# Patient Record
Sex: Female | Born: 1975 | Race: White | Hispanic: No | Marital: Married | Smoking: Never smoker
Health system: Southern US, Community
[De-identification: ages and names within clinical notes are randomized; demographics above are authoritative.]

## PROBLEM LIST (undated history)

## (undated) DIAGNOSIS — F419 Anxiety disorder, unspecified: Secondary | ICD-10-CM

## (undated) DIAGNOSIS — N920 Excessive and frequent menstruation with regular cycle: Secondary | ICD-10-CM

## (undated) DIAGNOSIS — K219 Gastro-esophageal reflux disease without esophagitis: Secondary | ICD-10-CM

## (undated) DIAGNOSIS — D219 Benign neoplasm of connective and other soft tissue, unspecified: Secondary | ICD-10-CM

## (undated) DIAGNOSIS — N946 Dysmenorrhea, unspecified: Secondary | ICD-10-CM

## (undated) DIAGNOSIS — G4733 Obstructive sleep apnea (adult) (pediatric): Secondary | ICD-10-CM

## (undated) DIAGNOSIS — I10 Essential (primary) hypertension: Secondary | ICD-10-CM

## (undated) HISTORY — DX: Obstructive sleep apnea (adult) (pediatric): G47.33

## (undated) HISTORY — DX: Benign neoplasm of connective and other soft tissue, unspecified: D21.9

## (undated) HISTORY — DX: Dysmenorrhea, unspecified: N94.6

## (undated) HISTORY — DX: Gastro-esophageal reflux disease without esophagitis: K21.9

## (undated) HISTORY — DX: Essential (primary) hypertension: I10

## (undated) HISTORY — DX: Anxiety disorder, unspecified: F41.9

## (undated) HISTORY — PX: ANTERIOR CRUCIATE LIGAMENT REPAIR: SHX115

## (undated) HISTORY — DX: Excessive and frequent menstruation with regular cycle: N92.0

---

## 1994-02-16 HISTORY — PX: OTHER SURGICAL HISTORY: SHX169

## 2007-07-25 ENCOUNTER — Observation Stay: Payer: Self-pay

## 2007-07-26 ENCOUNTER — Inpatient Hospital Stay: Payer: Self-pay

## 2008-01-23 DIAGNOSIS — M940 Chondrocostal junction syndrome [Tietze]: Secondary | ICD-10-CM | POA: Insufficient documentation

## 2009-06-26 IMAGING — US US EXTREM LOW VENOUS*R*
1 series · 17 of 24 positions shown · non-contrast
Comparison: none

REASON FOR EXAM: tender knot to Rt. upper calf near popliteal
COMMENTS:   LMP: Post Partum

[Series 1: us extrem low venous*right* · 17 of 24 slices shown]
[im 1/24]
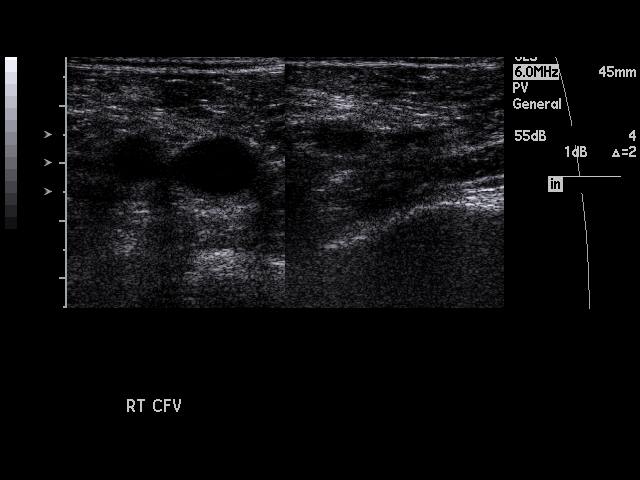
[im 3/24]
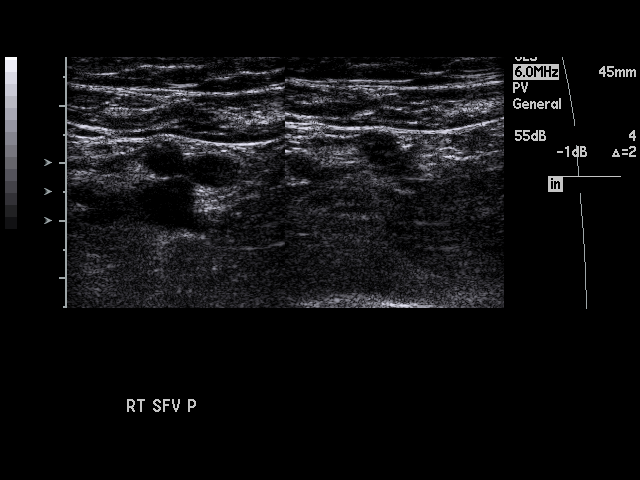
[im 4/24]
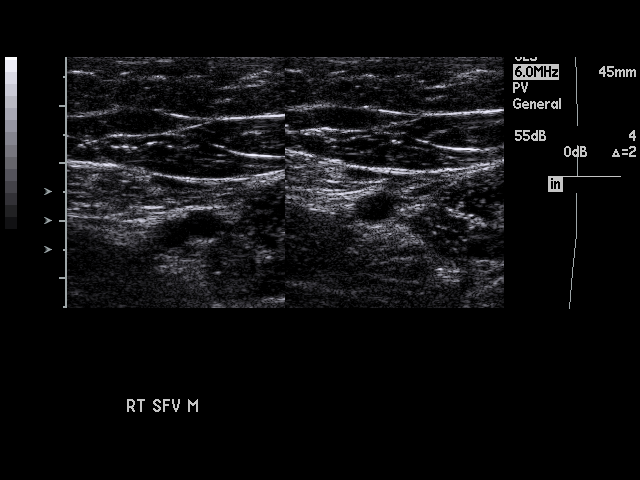
[im 5/24]
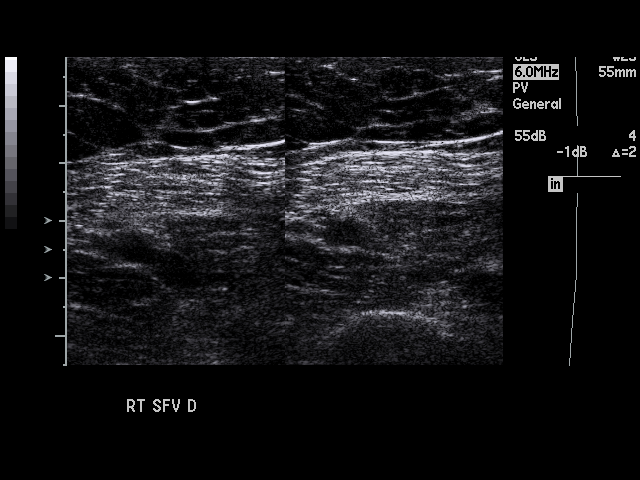
[im 7/24]
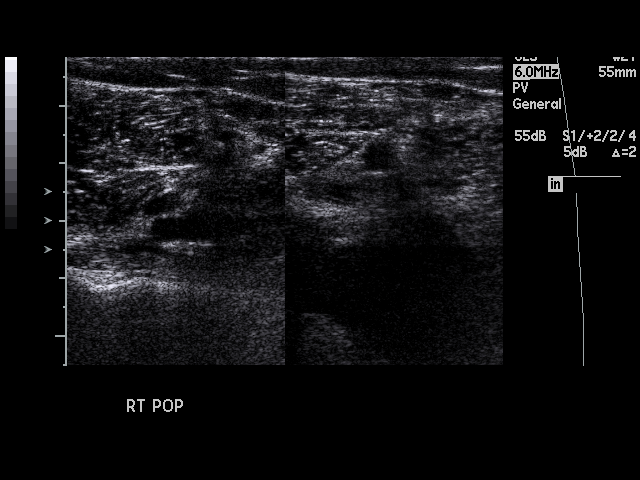
[im 8/24]
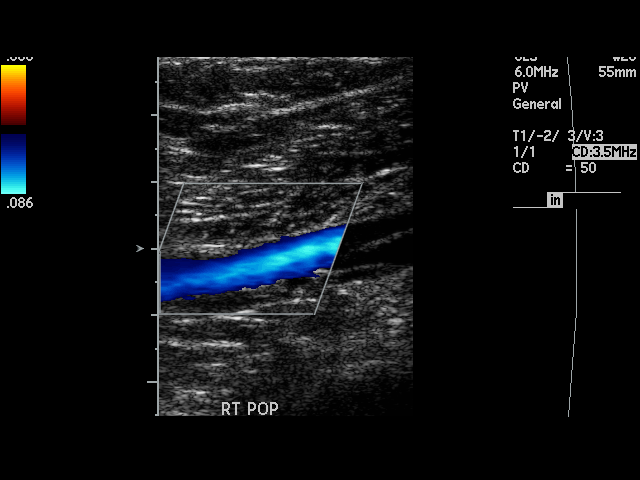
[im 10/24]
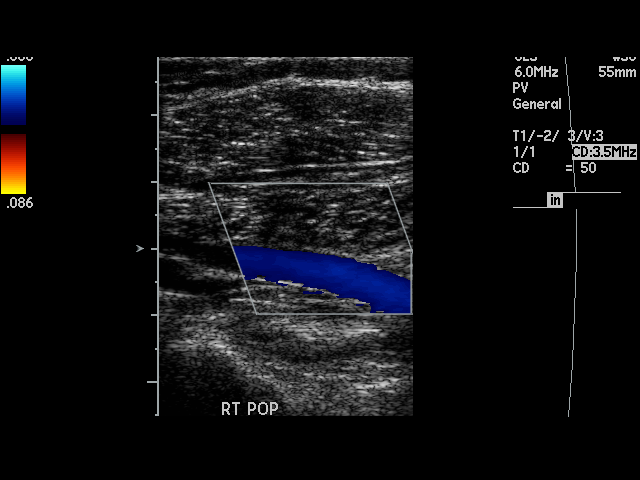
[im 11/24]
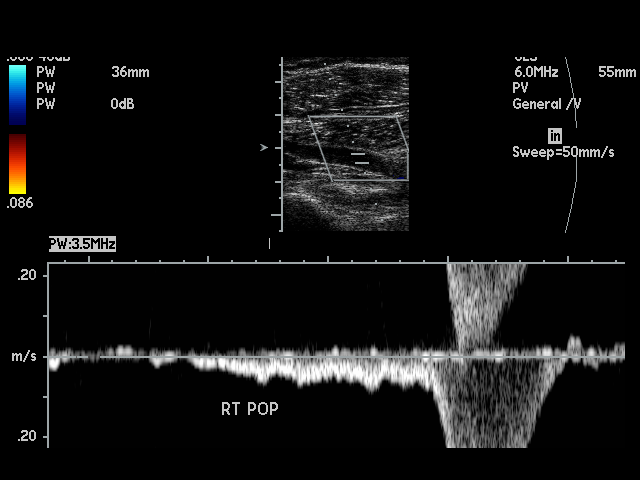
[im 13/24]
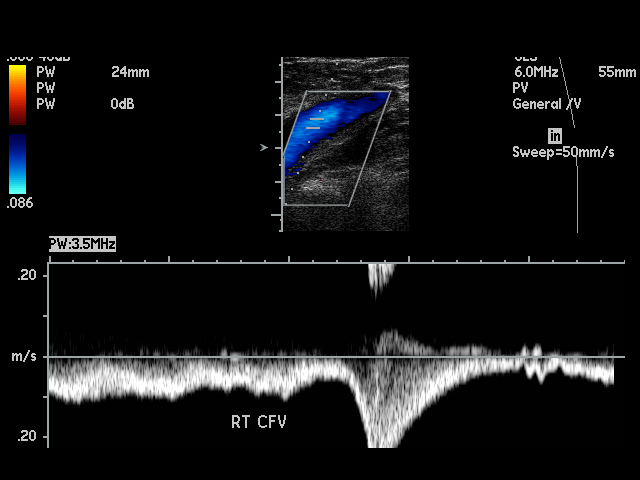
[im 14/24]
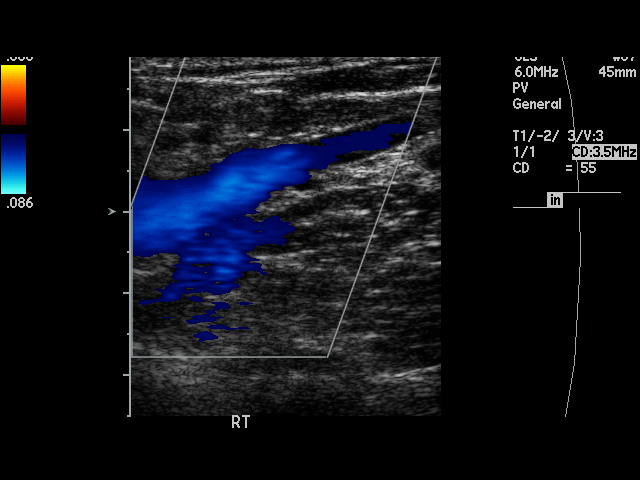
[im 15/24]
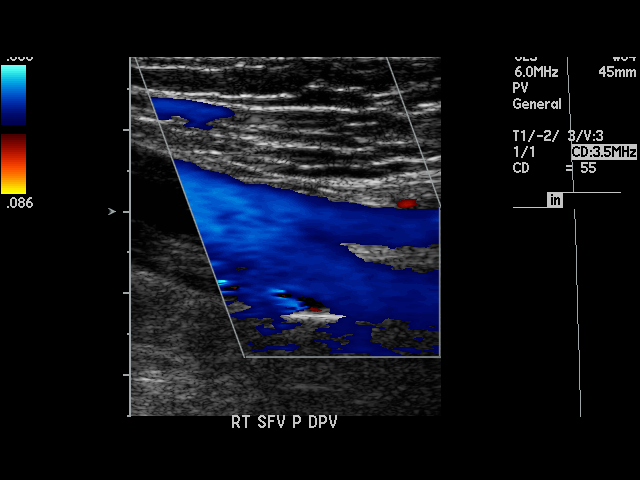
[im 17/24]
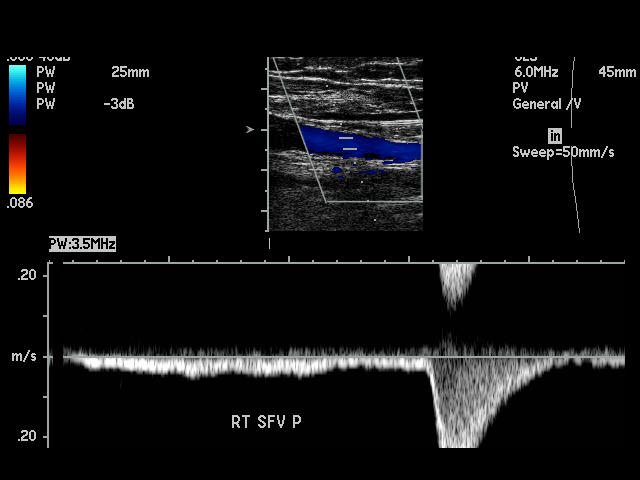
[im 18/24]
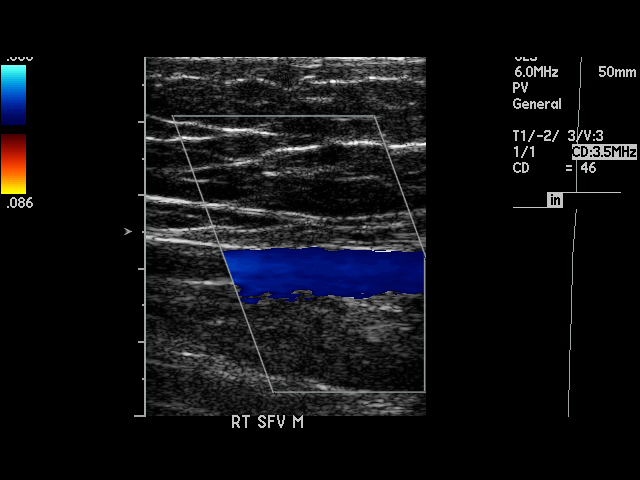
[im 20/24]
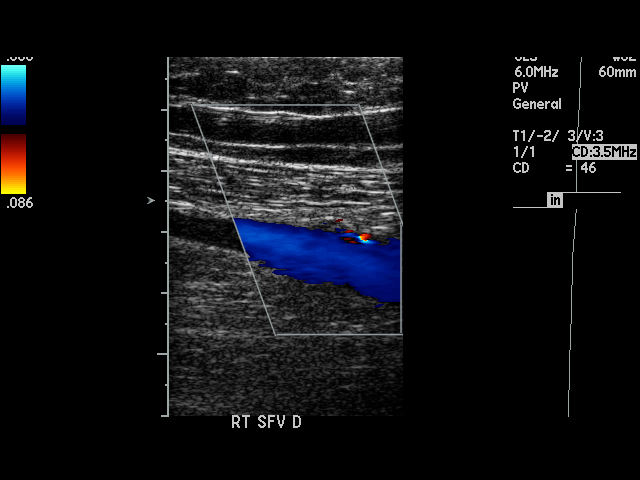
[im 21/24]
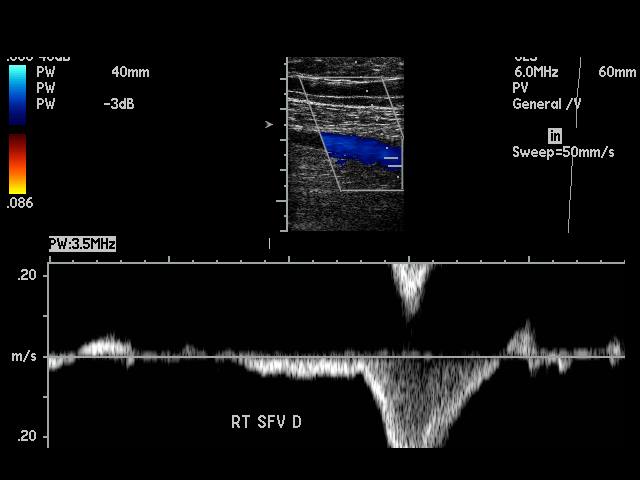
[im 22/24]
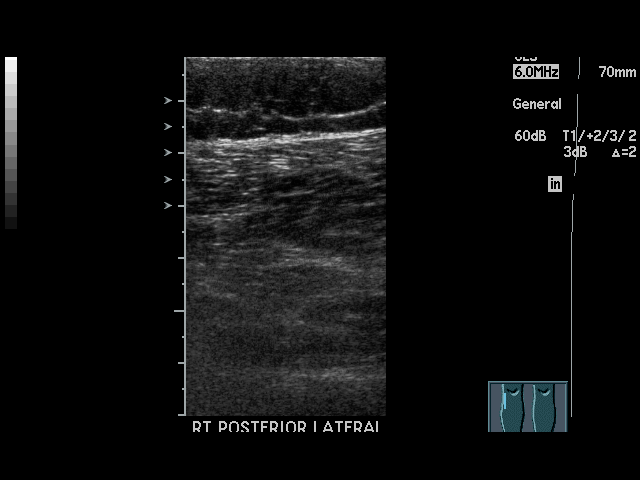
[im 24/24]
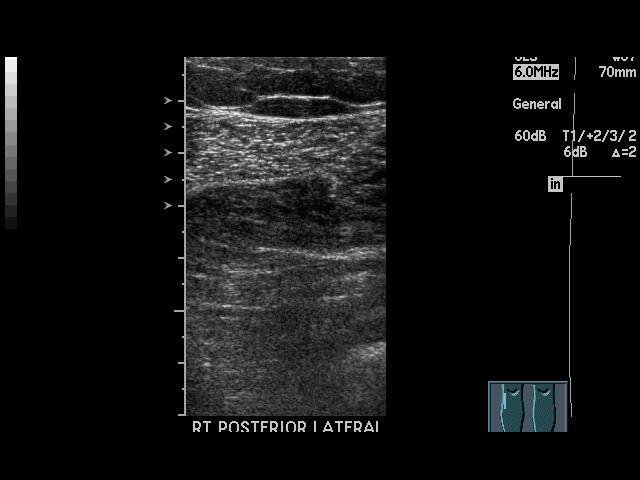

[17 of 24 positions shown; findings below may reference images not displayed]

PROCEDURE:     US  - US DOPPLER LOW EXTR RIGHT  - July 27, 2007  [DATE]

RESULT:     Grayscale, Duplex and SPECTRAL waveform imaging was performed of
the deep venous structures of the RIGHT lower extremity.  There is no
evidence of increased echogenicity, noncompressibility, abnormal waveform or
abnormal grayscale flow within the deep venous structures of the RIGHT lower
extremity.
IMPRESSION: No sonographic evidence of a deep venous thrombus within the interrogated
structures of the RIGHT lower extremity.

## 2013-07-11 LAB — HM MAMMOGRAPHY

## 2013-07-18 LAB — TSH: TSH: 1.63 u[IU]/mL (ref 0.41–5.90)

## 2013-07-18 LAB — LIPID PANEL
Cholesterol: 207 mg/dL — AB (ref 0–200)
HDL: 92 mg/dL — AB (ref 35–70)
LDL Cholesterol: 102 mg/dL
Triglycerides: 63 mg/dL (ref 40–160)

## 2013-07-18 LAB — BASIC METABOLIC PANEL
BUN: 8 mg/dL (ref 4–21)
CREATININE: 0.8 mg/dL (ref 0.5–1.1)
Glucose: 87 mg/dL
POTASSIUM: 4.3 mmol/L (ref 3.4–5.3)
SODIUM: 140 mmol/L (ref 137–147)

## 2013-07-18 LAB — CBC AND DIFFERENTIAL
HCT: 32 % — AB (ref 36–46)
Hemoglobin: 10.1 g/dL — AB (ref 12.0–16.0)
Platelets: 231 10*3/uL (ref 150–399)
WBC: 4.9 10^3/mL

## 2013-07-18 LAB — HEPATIC FUNCTION PANEL
ALT: 8 U/L (ref 7–35)
AST: 14 U/L (ref 13–35)

## 2014-08-25 DIAGNOSIS — F4010 Social phobia, unspecified: Secondary | ICD-10-CM | POA: Insufficient documentation

## 2014-08-25 DIAGNOSIS — R030 Elevated blood-pressure reading, without diagnosis of hypertension: Secondary | ICD-10-CM

## 2014-08-25 DIAGNOSIS — M25569 Pain in unspecified knee: Secondary | ICD-10-CM | POA: Insufficient documentation

## 2014-08-25 DIAGNOSIS — IMO0001 Reserved for inherently not codable concepts without codable children: Secondary | ICD-10-CM | POA: Insufficient documentation

## 2014-08-25 DIAGNOSIS — G4730 Sleep apnea, unspecified: Secondary | ICD-10-CM | POA: Insufficient documentation

## 2014-08-25 DIAGNOSIS — L919 Hypertrophic disorder of the skin, unspecified: Secondary | ICD-10-CM | POA: Insufficient documentation

## 2014-08-25 DIAGNOSIS — M25539 Pain in unspecified wrist: Secondary | ICD-10-CM | POA: Insufficient documentation

## 2014-08-25 DIAGNOSIS — K219 Gastro-esophageal reflux disease without esophagitis: Secondary | ICD-10-CM | POA: Insufficient documentation

## 2014-08-25 DIAGNOSIS — L918 Other hypertrophic disorders of the skin: Secondary | ICD-10-CM | POA: Insufficient documentation

## 2014-08-28 ENCOUNTER — Ambulatory Visit (INDEPENDENT_AMBULATORY_CARE_PROVIDER_SITE_OTHER): Payer: Managed Care, Other (non HMO) | Admitting: Family Medicine

## 2014-08-28 ENCOUNTER — Encounter: Payer: Self-pay | Admitting: Family Medicine

## 2014-08-28 VITALS — BP 114/84 | HR 66 | Temp 98.5°F | Resp 16 | Ht 63.5 in | Wt 134.4 lb

## 2014-08-28 DIAGNOSIS — L0232 Furuncle of buttock: Secondary | ICD-10-CM | POA: Diagnosis not present

## 2014-08-28 MED ORDER — MUPIROCIN CALCIUM 2 % EX CREA: 1.0000 "application " | TOPICAL_CREAM | Freq: Three times a day (TID) | CUTANEOUS | Status: DC

## 2014-08-28 MED ORDER — DOXYCYCLINE HYCLATE 100 MG PO TABS: 100.0000 mg | ORAL_TABLET | Freq: Two times a day (BID) | ORAL | Status: DC

## 2014-08-28 NOTE — Progress Notes (Signed)
Subjective:     Patient ID: Althia Forts, female   DOB: 03-05-1975, 39 y.o.   MRN: [id number]  HPI  Chief Complaint  Patient presents with  . Skin Problem    patient comes in office today with concerns of what she believes to be a boil located near her bikini line and buttock area since last week, she reports that she has tried hot compress and popping area but has only been able to get blood out  No hx of MRSA or exposure to the same. States she will usually get bumps in the summer over her bikini line but will resolve and not become painful. Denies insect bites.   Review of Systems  Constitutional: Negative for fever and chills.       Objective:   Physical Exam  Constitutional: She appears well-developed and well-nourished. No distress.  Skin:  Left central buttock with 0.5 cm inflamed papule. No pointing or drainage with mild tenderness.       Assessment:    1. Furuncle of buttock - mupirocin cream (BACTROBAN) 2 %; Apply 1 application topically 3 (three) times daily.  Dispense: 15 g; Refill: 0 - doxycycline (VIBRA-TABS) 100 MG tablet; Take 1 tablet (100 mg total) by mouth 2 (two) times daily.  Dispense: 14 tablet; Refill: 0    Plan:    Discussed bathtub soaks daily and topical rx first. If not improving to fill rx for doxycycline.

## 2014-08-28 NOTE — Patient Instructions (Signed)
Encouraged bathtub soaks daily for 5-10 minutes

## 2014-09-19 DIAGNOSIS — G4733 Obstructive sleep apnea (adult) (pediatric): Secondary | ICD-10-CM | POA: Insufficient documentation

## 2014-10-10 LAB — HM PAP SMEAR: HM Pap smear: NEGATIVE

## 2014-10-12 ENCOUNTER — Encounter: Payer: Self-pay | Admitting: Family Medicine

## 2014-11-29 ENCOUNTER — Ambulatory Visit (INDEPENDENT_AMBULATORY_CARE_PROVIDER_SITE_OTHER): Payer: Managed Care, Other (non HMO) | Admitting: Family Medicine

## 2014-11-29 ENCOUNTER — Encounter: Payer: Self-pay | Admitting: Family Medicine

## 2014-11-29 VITALS — BP 138/80 | HR 76 | Temp 98.5°F | Resp 20 | Ht 63.5 in | Wt 134.0 lb

## 2014-11-29 DIAGNOSIS — Z1239 Encounter for other screening for malignant neoplasm of breast: Secondary | ICD-10-CM | POA: Diagnosis not present

## 2014-11-29 DIAGNOSIS — Z0000 Encounter for general adult medical examination without abnormal findings: Secondary | ICD-10-CM | POA: Diagnosis not present

## 2014-11-29 DIAGNOSIS — R030 Elevated blood-pressure reading, without diagnosis of hypertension: Secondary | ICD-10-CM

## 2014-11-29 DIAGNOSIS — IMO0001 Reserved for inherently not codable concepts without codable children: Secondary | ICD-10-CM

## 2014-11-29 NOTE — Patient Instructions (Signed)
Please call the [facility] at [facility] to schedule this at [phone]   

## 2014-11-29 NOTE — Progress Notes (Signed)
Patient ID: Amber Coleman, female   DOB: 02/01/1976, 39 y.o.   MRN: [id number]        Patient: Amber Coleman, Female    DOB: January 14, 1976, 39 y.o.   MRN: [id number] Visit Date: 11/29/2014  Today's Provider: Margarita Rana, MD   Chief Complaint  Patient presents with  . Annual Exam   Subjective:    Annual physical exam Amber Coleman is a 39 y.o. female who presents today for health maintenance and complete physical. She feels well. She reports exercising daily. She reports she is sleeping fairly well, 6 hours . 07/05/13 CPE 10/2014 Pap-GYN 07/11/13 Mammo-BI-RADS 2 07/05/13 EKG  Lab Results  Component Value Date   WBC 4.9 07/18/2013   HGB 10.1* 07/18/2013   HCT 32* 07/18/2013   PLT 231 07/18/2013   CHOL 207* 07/18/2013   TRIG 63 07/18/2013   HDL 92* 07/18/2013   LDLCALC 102 07/18/2013   ALT 8 07/18/2013   AST 14 07/18/2013   NA 140 07/18/2013   K 4.3 07/18/2013   CREATININE 0.8 07/18/2013   BUN 8 07/18/2013   TSH 1.63 07/18/2013    -----------------------------------------------------------------   Review of Systems  Constitutional: Negative.   HENT: Negative.   Eyes: Negative.   Respiratory: Negative.   Cardiovascular: Negative.   Gastrointestinal: Negative.   Endocrine: Negative.   Genitourinary: Negative.   Musculoskeletal: Negative.   Skin: Negative.   Allergic/Immunologic: Negative.   Neurological: Negative.   Hematological: Negative.   Psychiatric/Behavioral: Negative.     Social History She  reports that she has never smoked. She has never used smokeless tobacco. She reports that she drinks about 3.6 oz of alcohol per week. She reports that she does not use illicit drugs. Social History   Social History  . Marital Status: Married    Spouse Name: N/A  . Number of Children: 2  . Years of Education: N/A   Social History Main Topics  . Smoking status: Never Smoker   . Smokeless tobacco: Never Used  . Alcohol Use: 3.6 oz/week    6 Glasses of wine per  week  . Drug Use: No  . Sexual Activity: Not Asked   Other Topics Concern  . None   Social History Narrative    Patient Active Problem List   Diagnosis Date Noted  . Blood pressure elevated 08/25/2014  . 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 Acid reflux 08/25/2014  . 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 Apnea, sleep 08/25/2014  . Hypertrophic condition of skin 08/25/2014  . 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 Wisdom teeth   1996    Family History  Family Status  Relation Status Death Age  . Mother Alive   . Father Deceased 32    MI  . Sister Alive   . Brother Alive   . Sister Alive   . Brother Alive    Her family history includes Anxiety disorder in her brother, brother, sister, and sister; Heart disease in her sister; Hypertension in her sister; Multiple sclerosis in her brother.    Allergies  Allergen Reactions  . Oxycodone-Acetaminophen     Previous Medications   EVENING PRIMROSE OIL PO    Take 2 tablets by mouth daily.   MAGNESIUM 250 MG TABS    Take 1 tablet by mouth 2 (two) times daily.   OVER THE COUNTER MEDICATION    Take 16 drops by mouth 2 (two) times daily. False  unicorn   OVER THE COUNTER MEDICATION    Take 3 drops by mouth daily. Xodine   OVER THE COUNTER MEDICATION    Take 2 tablets by mouth daily. [employer]    Patient Care Team: Margarita Rana, MD as PCP - General (Family Medicine)     Objective:   Vitals: BP 140/86 mmHg  Pulse 76  Temp(Src) 98.5 F (36.9 C)  Resp 20  Ht 5' 3.5" (1.613 m)  Wt 134 lb (60.782 kg)  BMI 23.36 kg/m2  LMP 11/22/2014 (Exact Date)   Physical Exam  Constitutional: She is oriented to person, place, and time. She appears well-developed and well-nourished.  HENT:  Head: Normocephalic and atraumatic.  Right Ear: Tympanic membrane, external ear and ear canal normal.  Left Ear: Tympanic membrane, external ear and ear canal normal.    Nose: Nose normal.  Mouth/Throat: Uvula is midline, oropharynx is clear and moist and mucous membranes are normal.  Eyes: Conjunctivae, EOM and lids are normal. Pupils are equal, round, and reactive to light.  Neck: Trachea normal and normal range of motion. Neck supple. Carotid bruit is not present. No thyroid mass and no thyromegaly present.  Cardiovascular: Normal rate, regular rhythm and normal heart sounds.   Pulmonary/Chest: Effort normal and breath sounds normal.  Abdominal: Soft. Normal appearance and bowel sounds are normal. There is no hepatosplenomegaly. There is no tenderness.  Genitourinary: No breast swelling, tenderness or discharge.  Musculoskeletal: Normal range of motion.  Lymphadenopathy:    She has no cervical adenopathy.    She has no axillary adenopathy.  Neurological: She is alert and oriented to person, place, and time. She has normal strength. No cranial nerve deficit.  Skin: Skin is warm, dry and intact.  Psychiatric: She has a normal mood and affect. Her speech is normal and behavior is normal. Judgment and thought content normal. Cognition and memory are normal.     Depression Screen PHQ 2/9 Scores 11/29/2014  PHQ - 2 Score 0      Assessment & Plan:     Routine Health Maintenance and Physical Exam  Exercise Activities and Dietary recommendations Goals    . Exercise 150 minutes per week (moderate activity)        There is no immunization history on file for this patient.  Health Maintenance  Topic Date Due  . HIV Screening  08/20/1988  . TETANUS/TDAP  08/20/1992  . PAP SMEAR  08/21/1994  . INFLUENZA VACCINE  09/17/2014     1. Annual physical exam Stable. Continue to eat healthy and exercise.   - Lipid Panel With LDL/HDL Ratio  2. Blood pressure elevated Elevated today. Was ok last visit. Monitor, decrease salt, decrease stress, continue to eat healthy and exercise and call if not improving.  - CBC with Differential/Platelet -  Comprehensive metabolic panel - TSH  3. Breast cancer screening Patient will call.  - MM DIGITAL SCREENING BILATERAL; Future  Patient was seen and examined by Jerrell Belfast, MD, and note scribed by Lynford Humphrey, [place].   I have reviewed the document for accuracy and completeness and I agree with above. Jerrell Belfast, MD   Margarita Rana, MD  --------------------------------------------------------------------

## 2014-12-01 LAB — COMPREHENSIVE METABOLIC PANEL
A/G RATIO: 2 (ref 1.1–2.5)
ALBUMIN: 4.5 g/dL (ref 3.5–5.5)
ALK PHOS: 64 IU/L (ref 39–117)
ALT: 14 IU/L (ref 0–32)
AST: 19 IU/L (ref 0–40)
BUN / CREAT RATIO: 8 — AB (ref 9–23)
BUN: 6 mg/dL (ref 6–24)
Bilirubin Total: 0.8 mg/dL (ref 0.0–1.2)
CALCIUM: 9.3 mg/dL (ref 8.7–10.2)
CO2: 24 mmol/L (ref 18–29)
Chloride: 101 mmol/L (ref 97–108)
Creatinine, Ser: 0.74 mg/dL (ref 0.57–1.00)
GFR calc Af Amer: 116 mL/min/{1.73_m2} (ref >59)
GFR, EST NON AFRICAN AMERICAN: 101 mL/min/{1.73_m2} (ref >59)
GLOBULIN, TOTAL: 2.2 g/dL (ref 1.5–4.5)
GLUCOSE: 84 mg/dL (ref 65–99)
POTASSIUM: 4.9 mmol/L (ref 3.5–5.2)
SODIUM: 140 mmol/L (ref 134–144)
Total Protein: 6.7 g/dL (ref 6.0–8.5)

## 2014-12-01 LAB — CBC WITH DIFFERENTIAL/PLATELET
Basophils Absolute: 0 10*3/uL (ref 0.0–0.2)
Basos: 1 %
EOS (ABSOLUTE): 0.3 10*3/uL (ref 0.0–0.4)
EOS: 6 %
Hematocrit: 36.5 % (ref 34.0–46.6)
Hemoglobin: 12.1 g/dL (ref 11.1–15.9)
IMMATURE GRANS (ABS): 0 10*3/uL (ref 0.0–0.1)
IMMATURE GRANULOCYTES: 0 %
LYMPHS: 32 %
Lymphocytes Absolute: 1.5 10*3/uL (ref 0.7–3.1)
MCH: 28.3 pg (ref 26.6–33.0)
MCHC: 33.2 g/dL (ref 31.5–35.7)
MCV: 86 fL (ref 79–97)
MONOS ABS: 0.5 10*3/uL (ref 0.1–0.9)
Monocytes: 11 %
NEUTROS PCT: 50 %
Neutrophils Absolute: 2.4 10*3/uL (ref 1.4–7.0)
Platelets: 281 10*3/uL (ref 150–379)
RBC: 4.27 x10E6/uL (ref 3.77–5.28)
RDW: 14.7 % (ref 12.3–15.4)
WBC: 4.8 10*3/uL (ref 3.4–10.8)

## 2014-12-01 LAB — LIPID PANEL WITH LDL/HDL RATIO
Cholesterol, Total: 214 mg/dL — ABNORMAL HIGH (ref 100–199)
HDL: 113 mg/dL (ref >39)
LDL Calculated: 89 mg/dL (ref 0–99)
LDL/HDL RATIO: 0.8 ratio (ref 0.0–3.2)
TRIGLYCERIDES: 61 mg/dL (ref 0–149)
VLDL Cholesterol Cal: 12 mg/dL (ref 5–40)

## 2014-12-01 LAB — TSH: TSH: 1.51 u[IU]/mL (ref 0.450–4.500)

## 2014-12-03 ENCOUNTER — Telehealth: Payer: Self-pay

## 2014-12-03 NOTE — Telephone Encounter (Signed)
Pt advised as directed below.   Thanks,   -Levorn Oleski  

## 2014-12-03 NOTE — Telephone Encounter (Signed)
-----   Message from Margarita Rana, MD sent at 12/01/2014  9:35 AM EDT ----- Labs stable. Please notify patient. Thanks.

## 2014-12-17 ENCOUNTER — Telehealth: Payer: Self-pay | Admitting: Family Medicine

## 2014-12-17 DIAGNOSIS — IMO0001 Reserved for inherently not codable concepts without codable children: Secondary | ICD-10-CM

## 2014-12-17 DIAGNOSIS — R030 Elevated blood-pressure reading, without diagnosis of hypertension: Principal | ICD-10-CM

## 2014-12-17 MED ORDER — HYDROCHLOROTHIAZIDE 12.5 MG PO CAPS: 12.5000 mg | ORAL_CAPSULE | Freq: Every day | ORAL | Status: DC

## 2014-12-17 NOTE — Telephone Encounter (Signed)
Amber Coleman reported that she has been monitoring her blood pressures since 11/30/2014.  Her range is from 127-155/74-94.  Her top numbers 140's on average, and bottom numbers usually run below 90.  She did have a sleep study which confirmed sleep apnea.  She will probably get her machine sometime after January secondary to expense and a high deducible.  She was wondering if she should start a BP medication or wait until after her CPAP to see if her BP goes down on its own.  She did mention if she has to start a medicine her sister takes a low dose Clonidine and she tolerates it well.    Thanks,   -Mickel Baas

## 2014-12-17 NOTE — Telephone Encounter (Signed)
Pt states she's calling to follow up with Dr. Venia Minks about her BP readings please return her call @ [phone]. CC

## 2014-12-17 NOTE — Addendum Note (Signed)
Addended by: Jerrell Belfast on: 12/17/2014 03:32 PM   Modules accepted: Orders

## 2014-12-17 NOTE — Telephone Encounter (Signed)
Left message advising pt.   Thanks,   -Lorain Fettes  

## 2014-12-17 NOTE — Telephone Encounter (Signed)
LMTCB 12/17/2014  Thanks,   -Mickel Baas

## 2014-12-17 NOTE — Telephone Encounter (Signed)
Sent rx to [facility] for low dose fluid pill. May be able to stop once starts CPAP.  Thanks.

## 2015-01-01 ENCOUNTER — Telehealth: Payer: Self-pay | Admitting: Family Medicine

## 2015-01-01 DIAGNOSIS — I10 Essential (primary) hypertension: Secondary | ICD-10-CM

## 2015-01-01 MED ORDER — LISINOPRIL 10 MG PO TABS: 10.0000 mg | ORAL_TABLET | Freq: Every day | ORAL | Status: DC

## 2015-01-01 NOTE — Telephone Encounter (Signed)
Pt called saying she thinks her blood pressure is still higher than it should be.  It ranges H-161/102 and  L-130/80,  Call back [phone].  Thanks [employer]

## 2015-01-01 NOTE — Telephone Encounter (Signed)
Talked with patient. Will add Lisinopril. Recheck labs in 4 weeks. Thanks.

## 2015-01-07 ENCOUNTER — Telehealth: Payer: Self-pay | Admitting: Family Medicine

## 2015-01-07 NOTE — Telephone Encounter (Signed)
LMTCB. Thanks.  Did not put on fluttering medication. Just  Blood pressure. Can change Lisinopril to Metoprolol if patient would like. Thanks.

## 2015-01-07 NOTE — Telephone Encounter (Signed)
Pt called saying she is still having fluttering feelings even after you have put her on medication.  She said her blood pressure readings have gotten better but she is still having the fluttering.  She made an appt with a cardiologist but her appt is not until Feb.  Please call her at [phone].  Thanks Con [employer]

## 2015-01-24 ENCOUNTER — Encounter: Payer: Self-pay | Admitting: Family Medicine

## 2015-03-01 DIAGNOSIS — R002 Palpitations: Secondary | ICD-10-CM | POA: Insufficient documentation

## 2015-03-13 ENCOUNTER — Telehealth: Payer: Self-pay

## 2015-03-13 NOTE — Telephone Encounter (Signed)
Called to inform pt that provider received echo report, which showed mild mitral regurgitation. Pt acknowledges understanding. Renaldo Fiddler, CMA

## 2015-03-26 ENCOUNTER — Encounter: Payer: Self-pay | Admitting: Family Medicine

## 2015-03-27 ENCOUNTER — Other Ambulatory Visit: Payer: Self-pay

## 2015-03-27 DIAGNOSIS — I10 Essential (primary) hypertension: Secondary | ICD-10-CM

## 2015-03-29 ENCOUNTER — Telehealth: Payer: Self-pay

## 2015-03-29 LAB — COMPREHENSIVE METABOLIC PANEL
A/G RATIO: 2.1 (ref 1.1–2.5)
ALBUMIN: 4.6 g/dL (ref 3.5–5.5)
ALT: 13 IU/L (ref 0–32)
AST: 18 IU/L (ref 0–40)
Alkaline Phosphatase: 50 IU/L (ref 39–117)
BILIRUBIN TOTAL: 1.1 mg/dL (ref 0.0–1.2)
BUN / CREAT RATIO: 11 (ref 9–23)
BUN: 8 mg/dL (ref 6–24)
CALCIUM: 9.1 mg/dL (ref 8.7–10.2)
CHLORIDE: 96 mmol/L (ref 96–106)
CO2: 22 mmol/L (ref 18–29)
Creatinine, Ser: 0.76 mg/dL (ref 0.57–1.00)
GFR, EST AFRICAN AMERICAN: 113 mL/min/{1.73_m2} (ref >59)
GFR, EST NON AFRICAN AMERICAN: 98 mL/min/{1.73_m2} (ref >59)
GLOBULIN, TOTAL: 2.2 g/dL (ref 1.5–4.5)
Glucose: 89 mg/dL (ref 65–99)
POTASSIUM: 3.8 mmol/L (ref 3.5–5.2)
Sodium: 138 mmol/L (ref 134–144)
TOTAL PROTEIN: 6.8 g/dL (ref 6.0–8.5)

## 2015-03-29 NOTE — Telephone Encounter (Signed)
-----   Message from Margarita Rana, MD sent at 03/29/2015  9:12 AM EST ----- Labs stable. Please notify patient. Thanks.

## 2015-03-29 NOTE — Telephone Encounter (Signed)
Advised pt of lab results. Pt verbally acknowledges understanding. Tahiri Shareef Drozdowski, CMA   

## 2015-03-29 NOTE — Telephone Encounter (Signed)
LMTCB Emily Drozdowski, CMA  

## 2015-05-23 ENCOUNTER — Encounter: Payer: Self-pay | Admitting: Physician Assistant

## 2015-05-23 ENCOUNTER — Ambulatory Visit (INDEPENDENT_AMBULATORY_CARE_PROVIDER_SITE_OTHER): Payer: Managed Care, Other (non HMO) | Admitting: Physician Assistant

## 2015-05-23 VITALS — BP 118/78 | HR 76 | Temp 98.5°F | Resp 16 | Wt 133.0 lb

## 2015-05-23 DIAGNOSIS — R05 Cough: Secondary | ICD-10-CM

## 2015-05-23 DIAGNOSIS — J069 Acute upper respiratory infection, unspecified: Secondary | ICD-10-CM | POA: Diagnosis not present

## 2015-05-23 DIAGNOSIS — R059 Cough, unspecified: Secondary | ICD-10-CM

## 2015-05-23 MED ORDER — HYDROCODONE-HOMATROPINE 5-1.5 MG/5ML PO SYRP: 5.0000 mL | ORAL_SOLUTION | Freq: Three times a day (TID) | ORAL | Status: DC | PRN

## 2015-05-23 MED ORDER — BENZONATATE 200 MG PO CAPS: 200.0000 mg | ORAL_CAPSULE | Freq: Three times a day (TID) | ORAL | Status: DC | PRN

## 2015-05-23 NOTE — Progress Notes (Signed)
Patient: Amber Coleman Female    DOB: [birth year]   40 y.o.   MRN: [id number] Visit Date: 05/23/2015  Today's Provider: Mar Daring, PA-C   Chief Complaint  Patient presents with  . Cough   Subjective:    Cough This is a new problem. The current episode started 1 to 4 weeks ago (For 1 week). The problem has been gradually worsening. The problem occurs constantly. The cough is productive of sputum (Yellow-green). Associated symptoms include headaches, nasal congestion, postnasal drip, rhinorrhea and a sore throat. Pertinent negatives include no chest pain, chills, ear congestion, ear pain, fever or shortness of breath. Nothing aggravates the symptoms. She has tried nothing for the symptoms.  She does not have seasonal allergies. She did just recently travel from [place] after a week of vacation at about the same time she started showing signs of URI.      Allergies  Allergen Reactions  . Oxycodone-Acetaminophen    Previous Medications   LISINOPRIL (PRINIVIL,ZESTRIL) 10 MG TABLET    Take 1 tablet (10 mg total) by mouth daily.   VITAMIN D, ERGOCALCIFEROL, (DRISDOL) 50000 UNITS CAPS CAPSULE    Take 50,000 Units by mouth daily.    Review of Systems  Constitutional: Negative for fever, chills and fatigue.  HENT: Positive for congestion, postnasal drip, rhinorrhea, sneezing and sore throat. Negative for ear pain, sinus pressure and tinnitus.   Respiratory: Positive for cough. Negative for chest tightness and shortness of breath.   Cardiovascular: Negative for chest pain, palpitations and leg swelling.  Gastrointestinal: Negative for nausea, vomiting and abdominal pain.  Neurological: Positive for headaches. Negative for dizziness.    Social History  Substance Use Topics  . Smoking status: Never Smoker   . Smokeless tobacco: Never Used  . Alcohol Use: 3.6 oz/week    6 Glasses of wine per week   Objective:   BP 118/78 mmHg  Pulse 76  Temp(Src) 98.5 F (36.9 C) (Oral)   Resp 16  Wt 133 lb (60.328 kg)  SpO2 99%  LMP 05/15/2015  Physical Exam  Constitutional: She appears well-developed and well-nourished. No distress.  HENT:  Head: Normocephalic and atraumatic.  Right Ear: Hearing, tympanic membrane, external ear and ear canal normal.  Left Ear: Hearing, tympanic membrane, external ear and ear canal normal.  Nose: Mucosal edema and rhinorrhea present. Right sinus exhibits no maxillary sinus tenderness and no frontal sinus tenderness. Left sinus exhibits no maxillary sinus tenderness and no frontal sinus tenderness.  Mouth/Throat: Uvula is midline, oropharynx is clear and moist and mucous membranes are normal. No oropharyngeal exudate, posterior oropharyngeal edema or posterior oropharyngeal erythema.  Eyes: Conjunctivae are normal. Pupils are equal, round, and reactive to light. Right eye exhibits no discharge. Left eye exhibits no discharge. No scleral icterus.  Neck: Normal range of motion. Neck supple. No tracheal deviation present. No thyromegaly present.  Cardiovascular: Normal rate, regular rhythm and normal heart sounds.  Exam reveals no gallop and no friction rub.   No murmur heard. Pulmonary/Chest: Effort normal and breath sounds normal. No stridor. No respiratory distress. She has no wheezes. She has no rales.  Lymphadenopathy:    She has no cervical adenopathy.  Skin: Skin is warm and dry. She is not diaphoretic.  Vitals reviewed.       Assessment & Plan:     1. Upper respiratory infection Most likely viral. Advised she may try Coricidin HBP or Mucinex DM for congestion. She may take tylenol or  IBU if needed for fevers and body aches. She needs to get plenty of rest and stay well hydrated. She is to call if symptoms worsen or fail to improve in 7-10 days.  2. Cough Worsening cough. Will give tessalon perles for daytime cough and hycodan for nighttime cough. She needs to stay well hydrated. She is to call if symptoms fail to improve or  worsen. - benzonatate (TESSALON) 200 MG capsule; Take 1 capsule (200 mg total) by mouth 3 (three) times daily as needed for cough.  Dispense: 30 capsule; Refill: 0 - HYDROcodone-homatropine (HYCODAN) 5-1.5 MG/5ML syrup; Take 5 mLs by mouth every 8 (eight) hours as needed.  Dispense: 180 mL; Refill: 0       Mar Daring, PA-C  [facility]

## 2015-05-23 NOTE — Patient Instructions (Signed)
Upper Respiratory Infection, Adult Most upper respiratory infections (URIs) are a viral infection of the air passages leading to the lungs. A URI affects the nose, throat, and upper air passages. The most common type of URI is nasopharyngitis and is typically referred to as "the common cold." URIs run their course and usually go away on their own. Most of the time, a URI does not require medical attention, but sometimes a bacterial infection in the upper airways can follow a viral infection. This is called a secondary infection. Sinus and middle ear infections are common types of secondary upper respiratory infections. Bacterial pneumonia can also complicate a URI. A URI can worsen asthma and chronic obstructive pulmonary disease (COPD). Sometimes, these complications can require emergency medical care and may be life threatening.  CAUSES Almost all URIs are caused by viruses. A virus is a type of germ and can spread from one person to another.  RISKS FACTORS You may be at risk for a URI if:   You smoke.   You have chronic heart or lung disease.  You have a weakened defense (immune) system.   You are very young or very old.   You have nasal allergies or asthma.  You work in crowded or poorly ventilated areas.  You work in health care facilities or schools. SIGNS AND SYMPTOMS  Symptoms typically develop 2-3 days after you come in contact with a cold virus. Most viral URIs last 7-10 days. However, viral URIs from the influenza virus (flu virus) can last 14-18 days and are typically more severe. Symptoms may include:   Runny or stuffy (congested) nose.   Sneezing.   Cough.   Sore throat.   Headache.   Fatigue.   Fever.   Loss of appetite.   Pain in your forehead, behind your eyes, and over your cheekbones (sinus pain).  Muscle aches.  DIAGNOSIS  Your health care provider may diagnose a URI by:  Physical exam.  Tests to check that your symptoms are not due to  another condition such as:  Strep throat.  Sinusitis.  Pneumonia.  Asthma. TREATMENT  A URI goes away on its own with time. It cannot be cured with medicines, but medicines may be prescribed or recommended to relieve symptoms. Medicines may help:  Reduce your fever.  Reduce your cough.  Relieve nasal congestion. HOME CARE INSTRUCTIONS   Take medicines only as directed by your health care provider.   Gargle warm saltwater or take cough drops to comfort your throat as directed by your health care provider.  Use a warm mist humidifier or inhale steam from a shower to increase air moisture. This may make it easier to breathe.  Drink enough fluid to keep your urine clear or pale yellow.   Eat soups and other clear broths and maintain good nutrition.   Rest as needed.   Return to work when your temperature has returned to normal or as your health care provider advises. You may need to stay home longer to avoid infecting others. You can also use a face mask and careful hand washing to prevent spread of the virus.  Increase the usage of your inhaler if you have asthma.   Do not use any tobacco products, including cigarettes, chewing tobacco, or electronic cigarettes. If you need help quitting, ask your health care provider. PREVENTION  The best way to protect yourself from getting a cold is to practice good hygiene.   Avoid oral or hand contact with people with cold   symptoms.   Wash your hands often if contact occurs.  There is no clear evidence that vitamin C, vitamin E, echinacea, or exercise reduces the chance of developing a cold. However, it is always recommended to get plenty of rest, exercise, and practice good nutrition.  SEEK MEDICAL CARE IF:   You are getting worse rather than better.   Your symptoms are not controlled by medicine.   You have chills.  You have worsening shortness of breath.  You have brown or red mucus.  You have yellow or brown nasal  discharge.  You have pain in your face, especially when you bend forward.  You have a fever.  You have swollen neck glands.  You have pain while swallowing.  You have white areas in the back of your throat. SEEK IMMEDIATE MEDICAL CARE IF:   You have severe or persistent:  Headache.  Ear pain.  Sinus pain.  Chest pain.  You have chronic lung disease and any of the following:  Wheezing.  Prolonged cough.  Coughing up blood.  A change in your usual mucus.  You have a stiff neck.  You have changes in your:  Vision.  Hearing.  Thinking.  Mood. MAKE SURE YOU:   Understand these instructions.  Will watch your condition.  Will get help right away if you are not doing well or get worse.   This information is not intended to replace advice given to you by your health care provider. Make sure you discuss any questions you have with your health care provider.   Document Released: 07/29/2000 Document Revised: 06/19/2014 Document Reviewed: 05/10/2013 Elsevier Interactive Patient Education 2016 [employer] Inc[employer]  

## 2016-05-13 HISTORY — PX: BUNIONECTOMY: SHX129

## 2016-05-20 ENCOUNTER — Telehealth: Payer: Self-pay

## 2016-05-20 NOTE — Telephone Encounter (Signed)
Pt calling for CLG/her nurse re bleeding between periods.  Has annual coming up on the 26th.  She started bleeding 3/17 stopped 3/22.  Started again on 3/26 and been bleeding since.  She is not on birthcontrol.  Should she be concerned?  What to do?  Please call, okay to leave vm.  [phone]

## 2016-05-21 NOTE — Telephone Encounter (Signed)
Left msg for pt to call back

## 2016-05-26 NOTE — Telephone Encounter (Signed)
Tried to contact pt again. [place].

## 2016-06-02 NOTE — Telephone Encounter (Signed)
Pt needs to discuss at annual visit.

## 2016-06-11 ENCOUNTER — Ambulatory Visit (INDEPENDENT_AMBULATORY_CARE_PROVIDER_SITE_OTHER): Payer: Managed Care, Other (non HMO) | Admitting: Certified Nurse Midwife

## 2016-06-11 ENCOUNTER — Encounter: Payer: Self-pay | Admitting: Certified Nurse Midwife

## 2016-06-11 VITALS — BP 120/80 | HR 78 | Ht 63.5 in | Wt 128.0 lb

## 2016-06-11 DIAGNOSIS — N920 Excessive and frequent menstruation with regular cycle: Secondary | ICD-10-CM

## 2016-06-11 DIAGNOSIS — N939 Abnormal uterine and vaginal bleeding, unspecified: Secondary | ICD-10-CM | POA: Diagnosis not present

## 2016-06-11 DIAGNOSIS — Z01419 Encounter for gynecological examination (general) (routine) without abnormal findings: Secondary | ICD-10-CM

## 2016-06-11 DIAGNOSIS — N9411 Superficial (introital) dyspareunia: Secondary | ICD-10-CM

## 2016-06-16 HISTORY — PX: LIPOSUCTION: SHX10

## 2016-06-16 NOTE — Progress Notes (Signed)
Gynecology Annual Exam  PCP: Si Raider Chief Complaint:  Chief Complaint  Patient presents with  . Gynecologic Exam    irregular bleeding    History of Present Illness: Patient is a 41 y.o. G80P2002 White female who presents for annual exam. The patient has several concerns: She had a normal menses March 17, stopped bleeding for 2 days then resumed bleeding (lighter bleeding than her menses) for another 2 weeks. She then had a normal menses again 06/08/2016. She denied cramping with the intermenstrual bleeding. Menses are usually monthly lasting 5-7 days with 2-4 heavier days requiring a pad change every 2 hours with small clots. She reports her cramping has decreased over the last year. Another concern is the size of her labia minora. The labia have been getting in the way of having IC and she reports pain with intercourse due to the labia. She was interested in surgery to resolve this problem. Her current form of contraception is a vasectomy. Since her last annual exam 10/10/2014, she has been diagnosed with OSA and uses a CPAP unit. She was also diagnosed with a LBBB and hypertension. Was on HCTZ and lisinopril at one point. She was undergoing a great deal of stress around that time. Her sister died in 04-Mar-2015 with cirrhosis. She adopted her niece. Her blood pressures improved over the last year and she was able to wean off her antihypertensives. She also had a left bunionectomy 05/13/2016 and is still wearing a boot on her left foot. Her last pap was 10/10/2014 and was NIL The patient had a breast MRI at [facility] 10/01/2015 and that was negative  The patient does perform self breast exams occasionally.  There is no notable family history of breast or ovarian cancer in her family. Lonna drinks 1-2 glasses of wine/night. She does not smoke or use illicit drugs.  The patient has regular exercise: yes. 5-7 days a week. She may get adequate calcium in her diet. She takes vitamin D3  supplements Her last cholesterol screen was 2016 and was normal.    The patient denies current symptoms of depression.    Review of Systems: Review of Systems  Constitutional: Negative for chills, fever and weight loss.  HENT: Negative for congestion, sinus pain and sore throat.   Eyes: Negative for blurred vision and pain.  Respiratory: Negative for hemoptysis, shortness of breath and wheezing.   Cardiovascular: Negative for chest pain, palpitations and leg swelling.  Gastrointestinal: Negative for abdominal pain, blood in stool, diarrhea, heartburn, nausea and vomiting.  Genitourinary: Negative for dysuria, frequency, hematuria and urgency.       Episode of abnormal uterine bleeding. Positive for dyspareunia  Musculoskeletal: Negative for back pain, joint pain and myalgias.       Left foot pain following left bunionectomy  Skin: Negative for itching and rash.  Neurological: Negative for dizziness, tingling and headaches.  Endo/Heme/Allergies: Negative for environmental allergies and polydipsia. Does not bruise/bleed easily.       Negative for hirsutism   Psychiatric/Behavioral: Negative for depression. The patient is not nervous/anxious and does not have insomnia.     Past Medical History:  Past Medical History:  Diagnosis Date  . Anxiety   . GERD (gastroesophageal reflux disease)   . Hypertension   . Menorrhagia   . Obstructive sleep apnea    uses CPAP    Past Surgical History:  Past Surgical History:  Procedure Laterality Date  . BUNIONECTOMY  05/13/2016  . wisdom teeth  1996     Family History:  Family History  Problem Relation Age of Onset  . Heart disease Father   . Heart disease Sister   . Hypertension Sister   . Anxiety disorder Sister   . Thyroid disease Sister   . Alcoholism Sister   . Anxiety disorder Brother   . Anxiety disorder Sister   . Anxiety disorder Brother   . Multiple sclerosis Brother   . Lung cancer Maternal Grandfather   . Diabetes  Paternal Grandfather   . Pancreatic cancer Paternal Grandfather     Social History:  Social History   Social History  . Marital status: Married    Spouse name: N/A  . Number of children: 2  . Years of education: N/A   Occupational History  . Not on file.   Social History Main Topics  . Smoking status: Never Smoker  . Smokeless tobacco: Never Used  . Alcohol use 3.6 oz/week    6 Glasses of wine per week  . Drug use: No  . Sexual activity: Yes    Birth control/ protection: Other-see comments     Comment: vasectomy   Other Topics Concern  . Not on file   Social History Narrative  . No narrative on file    Allergies:  Allergies  Allergen Reactions  . Oxycodone-Acetaminophen     Medications: Outpatient Encounter Prescriptions as of 06/11/2016  Medication Sig  . Cholecalciferol (VITAMIN D3) 5000 units CAPS Take 1 capsule by mouth daily.  . Lactobacillus (PROBIOTIC ACIDOPHILUS PO) Take 1 tablet by mouth daily.  . Levomefolate Glucosamine (METHYLFOLATE PO) Take 1 capsule by mouth daily.  . magnesium oxide (MAG-OX) 400 MG tablet Take 400 mg by mouth daily.  . Menaquinone-7 (VITAMIN K2 PO) Take 1 tablet by mouth daily.  . Pregnenolone Micronized POWD by Does not apply route.  . vitamin C (ASCORBIC ACID) 500 MG tablet Take 500 mg by mouth daily.  . [DISCONTINUED] benzonatate (TESSALON) 200 MG capsule Take 1 capsule (200 mg total) by mouth 3 (three) times daily as needed for cough.  . [DISCONTINUED] HYDROcodone-homatropine (HYCODAN) 5-1.5 MG/5ML syrup Take 5 mLs by mouth every 8 (eight) hours as needed.  . [DISCONTINUED] lisinopril (PRINIVIL,ZESTRIL) 10 MG tablet Take 1 tablet (10 mg total) by mouth daily. (Patient not taking: Reported on 05/23/2015)  . [DISCONTINUED] Vitamin D, Ergocalciferol, (DRISDOL) 50000 units CAPS capsule Take 50,000 Units by mouth daily.   No facility-administered encounter medications on file as of 06/11/2016.     Physical Exam Vitals: BP 120/80    Pulse 78   Ht 5' 3.5" (1.613 m)   Wt 58.1 kg (128 lb)   LMP 06/08/2016   BMI 22.32 kg/m   General: pleasant WF in NAD HEENT: normocephalic, anicteric Thyroid: no enlargement, no palpable nodules Pulmonary: No increased work of breathing, CTAB Cardiovascular: RRR without murmur Breast: Breast symmetrical, no tenderness, no palpable nodules or masses, no skin or nipple retraction present, no nipple discharge.  No axillary, infraclavicular, or supraclavicular lymphadenopathy. Abdomen: soft, non-tender, non-distended.  Umbilicus without lesions.  No hepatomegaly or masses palpable. No evidence of hernia  Genitourinary:  External: Wide labia minora bilaterally, no inflammation or lesions..  Normal urethral meatus, normal Bartholin's and Skene's glands.    Vagina: Normal vaginal mucosa, no evidence of prolapse.    Cervix: Grossly normal in appearance, no bleeding  Uterus: AF, mobile, TLNS, NT  Adnexa no adnexal masses, NT  Rectal: deferred  Lymphatic: no evidence of inguinal lymphadenopathy Extremities: no  edema, erythema, or tenderness Neurologic: Grossly intact Psychiatric: mood appropriate, affect full      Assessment: 41 y.o. [id number] well woman exam An episode of AUB-probably due to an anovulatory cycle Dyspareunia due to large labia Hx of menorrhagia-evaluated in 2013 with HSG (possible filling defect) and endometrial biopsy (normal biopsy)  Plan:  1) Breast cancer screening- continue annual screening with either MRI or mammogram at work  2) Referral to GYN doctor to discuss labiaplasty and possible surgical treatment for menorrhagia  3) ASCCP guidelines and rational discussed.  Patient opts for every 3 years screening interv  4) Routine healthcare maintenance including cholesterol, diabetes screening  managed by PCP or holistic provider   Dalia Heading, CNM

## 2016-06-17 ENCOUNTER — Encounter: Payer: Self-pay | Admitting: Certified Nurse Midwife

## 2016-06-17 DIAGNOSIS — N920 Excessive and frequent menstruation with regular cycle: Secondary | ICD-10-CM | POA: Insufficient documentation

## 2016-06-30 ENCOUNTER — Ambulatory Visit (INDEPENDENT_AMBULATORY_CARE_PROVIDER_SITE_OTHER): Payer: Managed Care, Other (non HMO) | Admitting: Obstetrics and Gynecology

## 2016-06-30 ENCOUNTER — Encounter: Payer: Self-pay | Admitting: Obstetrics and Gynecology

## 2016-06-30 VITALS — BP 118/74 | Ht 63.0 in | Wt 136.0 lb

## 2016-06-30 DIAGNOSIS — N941 Unspecified dyspareunia: Secondary | ICD-10-CM

## 2016-06-30 NOTE — Progress Notes (Signed)
Obstetrics & Gynecology Office Visit   Chief Complaint  Patient presents with  . Pelvic Pain  . Painful Intercourse    History of Present Illness: 41 y.o. G61P2002 female who presents with a several-year history of worsening dyspareunia.  She notes that initial penetration is difficult due to the folds of her labia.  Once intercourse is under way the discomfort is less.  She did not have this problem in the past.  She is exploring options for treatment. She has sought no prior treatment for this condition.    Review of Systems: Review of Systems  Constitutional: Negative.   Respiratory: Negative.   Cardiovascular: Negative.   Gastrointestinal: Negative.   Genitourinary:       Per HPI  Musculoskeletal: Negative.      Past Medical History:  Diagnosis Date  . Anxiety   . GERD (gastroesophageal reflux disease)   . Hypertension   . Menorrhagia   . Obstructive sleep apnea    uses CPAP    Past Surgical History:  Procedure Laterality Date  . BUNIONECTOMY  05/13/2016  . wisdom teeth   1996    Gynecologic History: Patient's last menstrual period was 06/08/2016.  Obstetric History: [id number]   Family History  Problem Relation Age of Onset  . Heart disease Father   . Heart disease Sister   . Hypertension Sister   . Anxiety disorder Sister   . Thyroid disease Sister   . Alcoholism Sister   . Anxiety disorder Brother   . Anxiety disorder Sister   . Anxiety disorder Brother   . Multiple sclerosis Brother   . Lung cancer Maternal Grandfather   . Diabetes Paternal Grandfather   . Pancreatic cancer Paternal Grandfather     Social History   Social History  . Marital status: Married    Spouse name: N/A  . Number of children: 2  . Years of education: N/A   Occupational History  . Not on file.   Social History Main Topics  . Smoking status: Never Smoker  . Smokeless tobacco: Never Used  . Alcohol use 3.6 oz/week    6 Glasses of wine per week  . Drug use: No  .  Sexual activity: Yes    Birth control/ protection: Other-see comments     Comment: vasectomy   Other Topics Concern  . Not on file   Social History Narrative  . No narrative on file    Allergies  Allergen Reactions  . Oxycodone-Acetaminophen     Medications:   Medication Sig Start Date End Date Taking? Authorizing Provider  Cholecalciferol (VITAMIN D3) 5000 units CAPS Take 1 capsule by mouth daily.    [provider]  Lactobacillus (PROBIOTIC ACIDOPHILUS PO) Take 1 tablet by mouth daily.    [provider]  Levomefolate Glucosamine (METHYLFOLATE PO) Take 1 capsule by mouth daily.    [provider]  magnesium oxide (MAG-OX) 400 MG tablet Take 400 mg by mouth daily.    [provider]  Menaquinone-7 (VITAMIN K2 PO) Take 1 tablet by mouth daily.    [provider]  Pregnenolone Micronized POWD by Does not apply route.    [provider]  vitamin C (ASCORBIC ACID) 500 MG tablet Take 500 mg by mouth daily.    [provider]    Physical Exam BP 118/74   Ht 5\' 3"  (1.6 m)   Wt 136 lb (61.7 kg)   LMP 06/08/2016   BMI 24.09 kg/m  Patient's last menstrual period  was 06/08/2016. Physical Exam  Constitutional: She is well-developed, well-nourished, and in no distress. No distress.  HENT:  Head: Normocephalic and atraumatic.  Abdominal: Soft. Bowel sounds are normal. She exhibits no distension. There is no tenderness.  Genitourinary:  Genitourinary Comments: External exam performed only. Labia minora slightly prominent, measuring about 3cm in width, symmetric bilaterally.  No erythema, induration, warmth, and tenderness.  No lesions noted.      Female chaperone present for pelvic and breast  portions of the physical exam  Assessment/Plan: 41 y.o. [id number] with dyspareunia.  Discussed that her particular anatomy is a variant of normal.  However, if she is having bothersome symptoms, a small procedure to remove any  redundant tissue could be performed.  Discussed that I could not guarantee full alleviation of her symptoms, not any particular cosmetic result.  The risks/benefits of doing this procedure were discussed. She would like to consider and will let me know.  20 minutes spent in face to face discussion with > 50% spent in counseling and management of her dyspareunia.   Prentice Docker, MD 06/30/2016 8:43 AM

## 2016-10-08 ENCOUNTER — Telehealth: Payer: Self-pay

## 2016-10-08 DIAGNOSIS — Z1272 Encounter for screening for malignant neoplasm of vagina: Secondary | ICD-10-CM

## 2016-10-08 DIAGNOSIS — Z1329 Encounter for screening for other suspected endocrine disorder: Secondary | ICD-10-CM

## 2016-10-08 DIAGNOSIS — Z1321 Encounter for screening for nutritional disorder: Secondary | ICD-10-CM

## 2016-10-08 DIAGNOSIS — Z131 Encounter for screening for diabetes mellitus: Secondary | ICD-10-CM

## 2016-10-08 DIAGNOSIS — Z13220 Encounter for screening for lipoid disorders: Secondary | ICD-10-CM

## 2016-10-08 NOTE — Telephone Encounter (Signed)
Pt has annual sched for 10/20/16 and would like to have order for blood work c [facility] beforehand so results can be gone over at appt.  Pt request full panel, ck thyroid d/t Hirshemoto's in family, BP has been off lately so whatever is all encompassing.  [phone]

## 2016-10-10 NOTE — Telephone Encounter (Signed)
Labs ordered.

## 2016-10-13 NOTE — Telephone Encounter (Signed)
Called pt to let her know labs have been ordered, no answer lvmtrc

## 2016-10-14 ENCOUNTER — Telehealth: Payer: Self-pay

## 2016-10-14 NOTE — Telephone Encounter (Signed)
Pt has appt next Tues, was told lab order was sent in, went this am to have drawn and they didn't have them.  Is there a specific location she needs to go to?  Wants to have results back before next Tues. [phone]

## 2016-10-16 LAB — CMP AND LIVER
ALT: 13 IU/L (ref 0–32)
AST: 20 IU/L (ref 0–40)
Albumin: 4.6 g/dL (ref 3.5–5.5)
Alkaline Phosphatase: 55 IU/L (ref 39–117)
BUN: 4 mg/dL — ABNORMAL LOW (ref 6–24)
Bilirubin Total: 0.3 mg/dL (ref 0.0–1.2)
Bilirubin, Direct: 0.1 mg/dL (ref 0.00–0.40)
CO2: 21 mmol/L (ref 20–29)
Calcium: 9.1 mg/dL (ref 8.7–10.2)
Chloride: 107 mmol/L — ABNORMAL HIGH (ref 96–106)
Creatinine, Ser: 0.66 mg/dL (ref 0.57–1.00)
GFR calc Af Amer: 125 mL/min/{1.73_m2} (ref >59)
GFR calc non Af Amer: 109 mL/min/{1.73_m2} (ref >59)
Glucose: 79 mg/dL (ref 65–99)
Potassium: 4.4 mmol/L (ref 3.5–5.2)
Sodium: 143 mmol/L (ref 134–144)
Total Protein: 6.8 g/dL (ref 6.0–8.5)

## 2016-10-16 LAB — HEMOGLOBIN A1C
ESTIMATED AVERAGE GLUCOSE: 94 mg/dL
HEMOGLOBIN A1C: 4.9 % (ref 4.8–5.6)

## 2016-10-16 LAB — VITAMIN D 25 HYDROXY (VIT D DEFICIENCY, FRACTURES): Vit D, 25-Hydroxy: 39 ng/mL (ref 30.0–100.0)

## 2016-10-16 LAB — LIPID PANEL
CHOLESTEROL TOTAL: 201 mg/dL — AB (ref 100–199)
Chol/HDL Ratio: 2.4 ratio (ref 0.0–4.4)
HDL: 85 mg/dL (ref >39)
LDL CALC: 101 mg/dL — AB (ref 0–99)
Triglycerides: 75 mg/dL (ref 0–149)
VLDL CHOLESTEROL CAL: 15 mg/dL (ref 5–40)

## 2016-10-16 LAB — TSH: TSH: 0.26 u[IU]/mL — ABNORMAL LOW (ref 0.450–4.500)

## 2016-10-19 NOTE — Progress Notes (Signed)
Annual Tuesday, address labs then (thyroid needs f/u testing or treating)

## 2016-10-20 ENCOUNTER — Ambulatory Visit (INDEPENDENT_AMBULATORY_CARE_PROVIDER_SITE_OTHER): Payer: Managed Care, Other (non HMO) | Admitting: Maternal Newborn

## 2016-10-20 ENCOUNTER — Encounter: Payer: Self-pay | Admitting: Maternal Newborn

## 2016-10-20 VITALS — BP 140/82 | HR 75 | Ht 63.5 in | Wt 128.0 lb

## 2016-10-20 DIAGNOSIS — Z13220 Encounter for screening for lipoid disorders: Secondary | ICD-10-CM

## 2016-10-20 DIAGNOSIS — Z1329 Encounter for screening for other suspected endocrine disorder: Secondary | ICD-10-CM

## 2016-10-20 DIAGNOSIS — Z131 Encounter for screening for diabetes mellitus: Secondary | ICD-10-CM

## 2016-10-20 DIAGNOSIS — Z124 Encounter for screening for malignant neoplasm of cervix: Secondary | ICD-10-CM

## 2016-10-20 DIAGNOSIS — Z1321 Encounter for screening for nutritional disorder: Secondary | ICD-10-CM

## 2016-10-20 NOTE — Progress Notes (Signed)
Scheduled as an annual visit, but patient had annual in April without Pap and desires Pap now. Also followed up on labs requested and collected prior to visit; discussed low TSH result and ordered thyroid panel. Will determine follow-up/tx based on results. Patient also interested in [place] IUD and encouraged to call for appointment when she has decided.  Avel Sensor, CNM 10/20/2016  3:40 PM

## 2016-10-21 LAB — T3, FREE: T3, Free: 3 pg/mL (ref 2.0–4.4)

## 2016-10-21 LAB — T4, FREE: Free T4: 0.98 ng/dL (ref 0.82–1.77)

## 2016-10-21 LAB — THYROID PEROXIDASE ANTIBODY: THYROID PEROXIDASE ANTIBODY: 9 [IU]/mL (ref 0–34)

## 2016-10-22 LAB — PAP IG AND HPV HIGH-RISK
HPV, HIGH-RISK: NEGATIVE
PAP Smear Comment: 0

## 2016-10-23 ENCOUNTER — Encounter: Payer: Self-pay | Admitting: Maternal Newborn

## 2016-10-23 ENCOUNTER — Telehealth: Payer: Self-pay | Admitting: Maternal Newborn

## 2016-10-23 NOTE — Telephone Encounter (Signed)
Called and left message. Sent a patient note through MyChart explaining the lab results and asking patient to call or message with any questions.  Avel Sensor, CNM 10/23/2016  4:45 PM

## 2016-10-26 ENCOUNTER — Telehealth: Payer: Self-pay | Admitting: Maternal Newborn

## 2016-10-26 NOTE — Telephone Encounter (Signed)
Called patient back to explain lab results. She will call to schedule annual for next year.

## 2016-10-26 NOTE — Telephone Encounter (Signed)
Please call back

## 2016-10-26 NOTE — Telephone Encounter (Signed)
Pt is calling back due to missed call on her labs results. Please advise. May leave an detail message. [phone]

## 2016-12-17 HISTORY — PX: BUNIONECTOMY: SHX129

## 2017-01-13 MED FILL — HYDROCODON-APAP 5-325: 5-325 | 5 days supply | Qty: 33 | Fill #0

## 2017-01-18 ENCOUNTER — Telehealth: Payer: Self-pay | Admitting: Certified Nurse Midwife

## 2017-01-18 NOTE — Telephone Encounter (Signed)
Pt is schedule 01/19/17 with [facility] for mirena insertion

## 2017-01-19 ENCOUNTER — Encounter: Payer: Self-pay | Admitting: Certified Nurse Midwife

## 2017-01-19 ENCOUNTER — Ambulatory Visit (INDEPENDENT_AMBULATORY_CARE_PROVIDER_SITE_OTHER): Payer: Managed Care, Other (non HMO) | Admitting: Certified Nurse Midwife

## 2017-01-19 VITALS — BP 120/80 | HR 76 | Ht 63.5 in | Wt 132.0 lb

## 2017-01-19 DIAGNOSIS — Z30430 Encounter for insertion of intrauterine contraceptive device: Secondary | ICD-10-CM | POA: Diagnosis not present

## 2017-01-19 MED ORDER — LEVONORGESTREL 20 MCG/24HR IU IUD: 1.0000 | INTRAUTERINE_SYSTEM | Freq: Once | INTRAUTERINE | 0 refills | Status: DC

## 2017-01-19 NOTE — Progress Notes (Signed)
    GYNECOLOGY OFFICE PROCEDURE NOTE  Amber Coleman is a 41 y.o. [id number], LMP 01/17/2017, here for Mirena IUD insertion in treatment of her heavy menses.  Last pap smear was on 10/20/2016 and was ASCUS with negative HRHPV. Current form of contraception: vasectomy  IUD Insertion Procedure Note Patient identified, informed consent performed, consent signed.   Discussed risks of irregular bleeding, cramping, infection, expulsion,malpositioning or misplacement of the IUD outside the uterus which may require further procedure such as laparoscopy. Time out was performed.    On bimanual exam, uterus was anteflexed. Speculum placed in the vagina.  Cervix visualized.  Cleaned with Betadine x 3. Cervix was sprayed with Hurricaine anesthetic and  grasped anteriorly with a single tooth tenaculum.  Uterus sounded to 7 cm.  Mirena  IUD placed per manufacturer's recommendations.  Strings trimmed to 3 cm. Tenaculum was removed, and silver nitrate was applied to tenaculum sites for hemostasis.  Patient tolerated procedure well.   Patient was given post-procedure instructions. Patient was also asked to check IUD strings periodically and follow up in 4 weeks for IUD check.  Dalia Heading, CNM 01/19/17

## 2017-02-17 ENCOUNTER — Encounter: Payer: Self-pay | Admitting: Certified Nurse Midwife

## 2017-02-17 ENCOUNTER — Ambulatory Visit (INDEPENDENT_AMBULATORY_CARE_PROVIDER_SITE_OTHER): Payer: Managed Care, Other (non HMO) | Admitting: Certified Nurse Midwife

## 2017-02-17 VITALS — BP 128/78 | HR 77 | Ht 63.5 in | Wt 131.0 lb

## 2017-02-17 DIAGNOSIS — Z8742 Personal history of other diseases of the female genital tract: Secondary | ICD-10-CM

## 2017-02-17 DIAGNOSIS — Z30431 Encounter for routine checking of intrauterine contraceptive device: Secondary | ICD-10-CM | POA: Diagnosis not present

## 2017-02-17 NOTE — Progress Notes (Signed)
  History of Present Illness:  Amber Coleman is a 42 y.o. that had a Mirena IUD placed approximately 1 month ago for treatment of menorrhagia. Since that time, she states that she has had mild  vaginal bleeding. Has had to use a tampon twice, but most days just needing a panty liner. Denies abdominal pain or fever.  PMHx: She  has a past medical history of Anxiety, GERD (gastroesophageal reflux disease), Hypertension, Menorrhagia, and Obstructive sleep apnea. Also,  has a past surgical history that includes wisdom teeth  (1996); Bunionectomy (Left, 05/13/2016); and Bunionectomy (Right, 12/2016)., family history includes Alcoholism in her sister; Anxiety disorder in her brother, brother, sister, and sister; Diabetes in her paternal grandfather; Heart disease in her father and sister; Hypertension in her sister; Lung cancer in her maternal grandfather; Multiple sclerosis in her brother; Pancreatic cancer in her paternal grandfather; Thyroid disease in her sister.,  reports that  has never smoked. she has never used smokeless tobacco. She reports that she drinks about 3.6 oz of alcohol per week. She reports that she does not use drugs.  She has a current medication list which includes the following prescription(s): vitamin d3, hydrocodone-acetaminophen, levomefolate glucosamine, levonorgestrel, magnesium oxide, menaquinone-7, pregnenolone micronized, and vitamin c. Also, is allergic to oxycodone-acetaminophen and tape. Current form of contraception is vasectomy. ROS-see HPI  Physical Exam:  BP 128/78   Pulse 77   Ht 5' 3.5" (1.613 m)   Wt 131 lb (59.4 kg)   LMP 01/19/2017 (Approximate)   BMI 22.84 kg/m  Body mass index is 22.84 kg/m. Constitutional: Well nourished, well developed female in no acute distress.   Neuro: Grossly intact Psych:  Normal mood and affect.    Pelvic exam: External/BUS: no lesion, no discharge Vagina: no lesions, scant bloody discharge Cervix: Two IUD strings present seen  coming from the cervical os.   Assessment: IUD strings present in proper location; pt doing well Irregular lite bleeding is not uncommon in the first 2 months after IUD is inserted   Plan: Patient to return to office for persistent daily bleeding if it persists after the next month or for heavy bleeding She was amenable to this plan and we will see her back for annual (6 mos) and to follow up on menorrhagia and prn  Dalia Heading, CNM

## 2017-04-13 NOTE — Telephone Encounter (Signed)
Mirena received 01/19/17

## 2017-05-17 HISTORY — PX: BREAST ENHANCEMENT SURGERY: SHX7

## 2017-06-09 ENCOUNTER — Telehealth: Payer: Self-pay

## 2017-06-09 NOTE — Telephone Encounter (Signed)
Pt had Mirena inserted in 01/2017; has had irregular bleeding which she knows is normal; has been bleeding since April 1st; this past week bleeding has been really bad; she has soiled clothes several times; gone thru two boxes of super tampons since Monday; it is impeding lifestyle - "can't go anywhere without being prepared for a masacre"; Is currently out of state until Fri afternoon; has sched appt c [facility] for 5/3; does she need to be seen sooner?  [phone]  Left detailed msg if bleeding that heavy, it is our protocol if she is saturating a pad q46min-1hr to go to the ED.  Will send msg on to [place] as well.

## 2017-06-10 NOTE — Telephone Encounter (Signed)
Called and left voicemail for patient to call back to be schedule for ultrasound and follow up

## 2017-06-10 NOTE — Telephone Encounter (Signed)
Can we get her in for an ultrasound early next week and follow up with me sooner? If she doesn't mind, can have her see one of the doctors if they have something sooner. I don't think I am back in the office until Wednesday next week. I am here on Friday afternoon, but don't know if ultrasound is available Friday afternoon

## 2017-06-11 NOTE — Telephone Encounter (Signed)
Called and left voicemail for patient to call back.

## 2017-06-14 NOTE — Telephone Encounter (Signed)
Pt returning call to [place] regarding u/s order. Pt would like a referral to have her ultrasound done at [place] [facility] due to she works there and it would be no cost. Cb# [phone] thank you.

## 2017-06-16 ENCOUNTER — Telehealth: Payer: Self-pay

## 2017-06-16 NOTE — Telephone Encounter (Signed)
Pt has appt 5/3 at 9:30 but needs to have u/s done first.  She hasn't heard about ref to [facility].  Please call about rescheduling appt and sending referral.  [phone] (see preg msg)

## 2017-06-17 NOTE — Telephone Encounter (Signed)
Orders faxed. Dawn at [facility] will contact the patient directly for scheduling.

## 2017-06-17 NOTE — Telephone Encounter (Signed)
CAlled patient last night. She has now stopped bleeding. I want to proceed with getting pelvic ultrasound for IUD location. 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 Please see what we need to do to schedule the pelvic ultrasound there and how to get the results back to me. Thanks, [employer]

## 2017-06-18 ENCOUNTER — Ambulatory Visit: Payer: Managed Care, Other (non HMO) | Admitting: Certified Nurse Midwife

## 2017-06-28 NOTE — Telephone Encounter (Signed)
Fax sent and patient is aware. Ext given.

## 2017-06-28 NOTE — Telephone Encounter (Signed)
Pt states there was going to be an xray referral to [place]. She is requesting referral go to [facility] at Fax#[phone]. Pt would like it on Wednesday. Pt requests confirmation call when this has been sent.

## 2017-06-30 NOTE — Telephone Encounter (Signed)
Pt aware that Amber Coleman just refaxed the X ray to [phone]

## 2017-07-05 ENCOUNTER — Telehealth: Payer: Self-pay | Admitting: Certified Nurse Midwife

## 2017-07-05 DIAGNOSIS — D250 Submucous leiomyoma of uterus: Secondary | ICD-10-CM

## 2017-07-05 DIAGNOSIS — N939 Abnormal uterine and vaginal bleeding, unspecified: Secondary | ICD-10-CM

## 2017-07-05 NOTE — Telephone Encounter (Signed)
CAlled patient regarding her abdominal ultrasound: no IUD in pelvis, so most probably her IUD was expelled. Undecided regarding whether she desires hysterectomy for menorrhagia or whether she desires more conservative therapy ie, hysteroscopic resection of fibroid/ polypectomy? And ablation. Since unsure, will schedule for a saline infused sonohystogram with Dr Kenton Kingfisher to better evaluate submucosal fibroid and ?polyp vs fibroid in LUS. Dalia Heading, CNM

## 2017-07-07 ENCOUNTER — Telehealth: Payer: Self-pay | Admitting: Obstetrics & Gynecology

## 2017-07-07 NOTE — Telephone Encounter (Signed)
Patient advise of appointment schedule

## 2017-07-07 NOTE — Telephone Encounter (Signed)
[  07/07/2017 3:61 AM]  Amber Coleman:   MRN [id number] u/s and appt with Kenton Kingfisher is 07/15/17 at 330.  Please call her to let her know.   I called and left voicemail for patient to call back to confirm appt.

## 2017-07-15 ENCOUNTER — Ambulatory Visit (INDEPENDENT_AMBULATORY_CARE_PROVIDER_SITE_OTHER): Payer: Managed Care, Other (non HMO)

## 2017-07-15 ENCOUNTER — Other Ambulatory Visit: Payer: Self-pay | Admitting: Certified Nurse Midwife

## 2017-07-15 ENCOUNTER — Encounter: Payer: Self-pay | Admitting: Obstetrics & Gynecology

## 2017-07-15 ENCOUNTER — Ambulatory Visit (INDEPENDENT_AMBULATORY_CARE_PROVIDER_SITE_OTHER): Payer: Managed Care, Other (non HMO) | Admitting: Obstetrics & Gynecology

## 2017-07-15 VITALS — BP 128/80 | HR 51 | Ht 63.5 in | Wt 128.0 lb

## 2017-07-15 DIAGNOSIS — N939 Abnormal uterine and vaginal bleeding, unspecified: Secondary | ICD-10-CM

## 2017-07-15 DIAGNOSIS — D250 Submucous leiomyoma of uterus: Secondary | ICD-10-CM

## 2017-07-15 DIAGNOSIS — N920 Excessive and frequent menstruation with regular cycle: Secondary | ICD-10-CM

## 2017-07-15 DIAGNOSIS — D219 Benign neoplasm of connective and other soft tissue, unspecified: Secondary | ICD-10-CM | POA: Diagnosis not present

## 2017-07-15 NOTE — Patient Instructions (Signed)
Total Laparoscopic Hysterectomy A total laparoscopic hysterectomy is a minimally invasive surgery to remove your uterus and cervix. This surgery is performed by making several small cuts (incisions) in your abdomen. It can also be done with a thin, lighted tube (laparoscope) inserted into two small incisions in your lower abdomen. Your fallopian tubes and ovaries can be removed (bilateral salpingo-oophorectomy) during this surgery as well.Benefits of minimally invasive surgery include:  Less pain.  Less risk of blood loss.  Less risk of infection.  Quicker return to normal activities.  Tell a health care provider about:  Any allergies you have.  All medicines you are taking, including vitamins, herbs, eye drops, creams, and over-the-counter medicines.  Any problems you or family members have had with anesthetic medicines.  Any blood disorders you have.  Any surgeries you have had.  Any medical conditions you have. What are the risks? Generally, this is a safe procedure. However, as with any procedure, complications can occur. Possible complications include:  Bleeding.  Blood clots in the legs or lung.  Infection.  Injury to surrounding organs.  Problems with anesthesia.  Early menopause symptoms (hot flashes, night sweats, insomnia).  Risk of conversion to an open abdominal incision.  What happens before the procedure?  Ask your health care provider about changing or stopping your regular medicines.  Do not take aspirin or blood thinners (anticoagulants) for 1 week before the surgery or as told by your health care provider.  Do not eat or drink anything for 8 hours before the surgery or as told by your health care provider.  Quit smoking if you smoke.  Arrange for a ride home after surgery and for someone to help you at home during recovery. What happens during the procedure?  You will be given antibiotic medicine.  An IV tube will be placed in your arm. You  will be given medicine to make you sleep (general anesthetic).  A gas (carbon dioxide) will be used to inflate your abdomen. This will allow your surgeon to look inside your abdomen, perform your surgery, and treat any other problems found if necessary.  Three or four small incisions (often less than 1/2 inch) will be made in your abdomen. One of these incisions will be made in the area of your belly button (navel). The laparoscope will be inserted into the incision. Your surgeon will look through the laparoscope while doing your procedure.  Other surgical instruments will be inserted through the other incisions.  Your uterus may be removed through your vagina or cut into small pieces and removed through the small incisions.  Your incisions will be closed. What happens after the procedure?  The gas will be released from inside your abdomen.  You will be taken to the recovery area where a nurse will watch and check your progress. Once you are awake, stable, and taking fluids well, without other problems, you will return to your room or be allowed to go home.  There is usually minimal discomfort following the surgery because the incisions are so small.  You will be given pain medicine while you are in the hospital and for when you go home. This information is not intended to replace advice given to you by your health care provider. Make sure you discuss any questions you have with your health care provider. Document Released: 11/30/2006 Document Revised: 07/11/2015 Document Reviewed: 08/23/2012 Elsevier Interactive Patient Education  2017 [employer] Inc[employer]   Myomectomy Myomectomy is surgery to remove a noncancerous tumor (myoma) from  the uterus. Myomas are tumors made up of fibrous tissue. They are often called fibroid tumors. Fibroid tumors can range from the size of a pea to the size of a grapefruit. In a myomectomy, the fibroid tumor is removed without removing the uterus. Because these  tumors are rarely cancerous, this surgery is usually done only if the tumor is growing or causing symptoms such as pain, pressure, bleeding, or pain with intercourse. LET [facility] CARE PROVIDER KNOW ABOUT:  Any allergies you have.  All medicines you are taking, including vitamins, herbs, eye drops, creams, and over-the-counter medicines.  Previous problems you or members of your family have had with the use of anesthetics.  Any blood disorders you have.  Previous surgeries you have had.  Medical conditions you have. RISKS AND COMPLICATIONS Generally, this is a safe procedure. However, as with any procedure, complications can occur. Possible complications include:  Excessive bleeding.  Infection.  Injury to nearby organs.  Blood clots in the legs, chest, or brain.  Scar tissue on other organs and in the pelvis. This may require another surgery to remove the scar tissue.  BEFORE THE PROCEDURE  Ask your health care provider about changing or stopping your regular medicines. Avoid taking aspirin or blood thinners as directed by your health care provider.  Do not  eat or drink anything after midnight on the night before surgery.  If you smoke, do not  smoke for 2 weeks before the surgery.  Do not  drink alcohol the day before the surgery.  Arrange for someone to drive you home after the procedure or after your hospital stay. Also arrange for someone to help you with activities during your recovery. PROCEDURE You will be given medicine to make you sleep through the procedure (general anesthetic). Any of the following methods may be used to perform a myomectomy:  Small monitors will be put on your body. They are used to check your heart, blood pressure, and oxygen level.  An IV access tube will be put into one of your veins. Medicine will be able to flow directly into your body through this IV tube.  You might be given a medicine to help you relax (sedative).  You will be  given a medicine to make you sleep (general anesthetic). A breathing tube will be placed into your lungs during the procedure.  A thin, flexible tube (catheter) will be inserted into your bladder to collect urine.  Any of the following methods may be used to perform a myomectomy: ? Hysteroscopic myomectomy-This method may be used when the fibroid tumor is inside the cavity of the uterus. A long, thin tube that is like a telescope (hysteroscope) is inserted inside the uterus. A saline solution is put into your uterus. This expands the uterus and allows the surgeon to see the fibroids. Tools are passed through the hysteroscope to remove the fibroid tumor in pieces. ? Laparoscopic myomectomy-A few small cuts (incisions) are made in the lower abdomen. A thin, lighted tube with a tiny camera on the end (laparoscope) is inserted through one of the incisions. This gives the surgeon a good view of the area. The fibroid tumor is removed through the other incisions. The incisions are then closed with stitches (sutures) or staples. ? Abdominal myomectomy-This method is used when the fibroid tumor cannot be removed with a hysteroscope or laparoscope. The surgery is performed through a larger surgical incision in the abdomen. The fibroid tumor is removed through this incision. The incision is  closed with sutures or staples.  What to expect after the procedure  If you had a laparoscopic or hysteroscopic myomectomy, you may be able to go home the same day, or you may need to stay in the hospital overnight.  If you had an abdominal myomectomy, you may need to stay in the hospital for a few days.  Your IV access tube and catheter will be removed in 1-2 days.  You may be given medicine for pain or to help you sleep.  You may be given an antibiotic medicine, if needed. This information is not intended to replace advice given to you by your health care provider. Make sure you discuss any questions you have with your  health care provider. Document Released: 11/30/2006 Document Revised: 07/11/2015 Document Reviewed: 09/14/2012 Elsevier Interactive Patient Education  2017 [employer].

## 2017-07-15 NOTE — Progress Notes (Signed)
Sonohysterogram Procedure Note  The indications for this procedure were reviewed with the patient. The procedure was explained in detail and all questions were answered.  The patient was placed in the lithotomy position. A graves speculum was introduced into the vagina and the cervix was visualized. The cervix was prepped with iodine solution. A Cook's Hysterography catheter was then introduced into the uterine cavity and the speculum was removed.   Sterile sonohysterography with 3D Reconstruction was performed. The endometrial cavity was distended with sterile saline. The findings are as follows: Fundal fibroid, no submucosal fibroid or polyp  The patient tolerated the procedure well without complication, and was discharged to home.   Reasons for exam: Dysfunctional Uterine Bleeding Patient complains of irregular menses. She had been bleeding regularly. She is now bleeding every 28 days and menses are lasting 7 days. She changes her pad or tampon every 1 hours. Clots are large cm in size. Dysmenorrhea:mild, occurring throughout menses. Cyclic symptoms include: none. Current contraception: vasectomy. History of infertility: no. History of abnormal Pap smear: no.   Ultrasound demonstrates see above.  Prior US in [place] saw fibroids.  SHG today  PMHx: She  has a past medical history of Anxiety, GERD (gastroesophageal reflux disease), Hypertension, Menorrhagia, and Obstructive sleep apnea. Also,  has a past surgical history that includes wisdom teeth  (1996); Bunionectomy (Left, 05/13/2016); and Bunionectomy (Right, 12/2016)., family history includes Alcoholism in her sister; Anxiety disorder in her brother, brother, sister, and sister; Diabetes in her paternal grandfather; Heart disease in her father and sister; Hypertension in her sister; Lung cancer in her maternal grandfather; Multiple sclerosis in her brother; Pancreatic cancer in her paternal grandfather; Thyroid disease in her sister.,  reports  that she has never smoked. She has never used smokeless tobacco. She reports that she drinks about 3.6 oz of alcohol per week. She reports that she does not use drugs.  She has a current medication list which includes the following prescription(s): vitamin d3, hydrocodone-acetaminophen, levomefolate glucosamine, levonorgestrel, magnesium oxide, menaquinone-7, pregnenolone micronized, and vitamin c. Also, is allergic to oxycodone-acetaminophen and tape.  Review of Systems  All other systems reviewed and are negative.  Objective: BP 128/80   Pulse (!) 51   Ht 5' 3.5" (1.613 m)   Wt 128 lb (58.1 kg)   LMP 07/05/2017   BMI 22.32 kg/m   Physical examination Constitutional NAD, Conversant  Skin No rashes, lesions or ulceration.   Extremities: Moves all appropriately.  Normal ROM for age. No lymphadenopathy.  Neuro: Grossly intact  Psych: Oriented to PPT.  Normal mood. Normal affect.   Assessment:  Menorrhagia with regular cycle  Fibroid  Fibroid treatment such as [place], Lupron, Myomectomy, and Hysterectomy discussed in detail, with the pros and cons of each choice counseled.  No treatment as an option also discussed, as well as control of symptoms alone with hormone therapy. Information provided to the patient.  Info on hyst and myomectomy gv, to consider.  Barnett Applebaum, MD, [facility], [facility] 07/15/2017  5:22 PM

## 2017-12-16 ENCOUNTER — Other Ambulatory Visit: Payer: Self-pay

## 2017-12-16 ENCOUNTER — Encounter
Admission: RE | Admit: 2017-12-16 | Discharge: 2017-12-16 | Disposition: A | Discharge status: Home / Self Care | Payer: Managed Care, Other (non HMO) | Source: Ambulatory Visit | Attending: Obstetrics & Gynecology | Admitting: Obstetrics & Gynecology

## 2017-12-16 DIAGNOSIS — Z01812 Encounter for preprocedural laboratory examination: Secondary | ICD-10-CM | POA: Insufficient documentation

## 2017-12-16 LAB — CBC
HEMATOCRIT: 39.6 % (ref 36.0–46.0)
Hemoglobin: 13.1 g/dL (ref 12.0–15.0)
MCH: 31 pg (ref 26.0–34.0)
MCHC: 33.1 g/dL (ref 30.0–36.0)
MCV: 93.8 fL (ref 80.0–100.0)
NRBC: 0 % (ref 0.0–0.2)
Platelets: 196 10*3/uL (ref 150–400)
RBC: 4.22 MIL/uL (ref 3.87–5.11)
RDW: 13 % (ref 11.5–15.5)
WBC: 7.1 10*3/uL (ref 4.0–10.5)

## 2017-12-16 LAB — BASIC METABOLIC PANEL
ANION GAP: 10 (ref 5–15)
BUN: 7 mg/dL (ref 6–20)
CO2: 23 mmol/L (ref 22–32)
Calcium: 9.4 mg/dL (ref 8.9–10.3)
Chloride: 103 mmol/L (ref 98–111)
Creatinine, Ser: 0.54 mg/dL (ref 0.44–1.00)
GFR calc Af Amer: >60 mL/min (ref >60)
GFR calc non Af Amer: >60 mL/min (ref >60)
GLUCOSE: 116 mg/dL — AB (ref 70–99)
POTASSIUM: 3.8 mmol/L (ref 3.5–5.1)
Sodium: 136 mmol/L (ref 135–145)

## 2017-12-16 LAB — TYPE AND SCREEN
ABO/RH(D): O NEG
Antibody Screen: NEGATIVE
DAT, IgG: NEGATIVE
Weak D: POSITIVE

## 2017-12-16 NOTE — Patient Instructions (Signed)
Your procedure is scheduled on: Friday, December 24, 2017 Report to Day Surgery on the 2nd floor of the [facility]. To find out your arrival time, please call [phone] between 1PM - 3PM on: Thursday, December 23, 2017  REMEMBER: Instructions that are not followed completely may result in serious medical risk, up to and including death; or upon the discretion of your surgeon and anesthesiologist your surgery may need to be rescheduled.  Do not eat food after midnight the night before surgery.  No gum chewing, lozengers or hard candies.  You may however, drink CLEAR liquids up to 2 hours before you are scheduled to arrive for your surgery. Do not drink anything within 2 hours of the start of your surgery.  Clear liquids include: - water  - apple juice without pulp - gatorade - black coffee or tea (Do NOT add milk or creamers to the coffee or tea) Do NOT drink anything that is not on this list.  No Alcohol for 24 hours before or after surgery.  No Smoking including e-cigarettes for 24 hours prior to surgery.  No chewable tobacco products for at least 6 hours prior to surgery.  No nicotine patches on the day of surgery.  On the morning of surgery brush your teeth with toothpaste and water, you may rinse your mouth with mouthwash if you wish. Do not swallow any toothpaste or mouthwash.  Notify your doctor if there is any change in your medical condition (cold, fever, infection).  Do not wear jewelry, make-up, hairpins, clips or nail polish.  Do not wear lotions, powders, or perfumes.   Do not shave 48 hours prior to surgery.   Contacts and dentures may not be worn into surgery.  Do not bring valuables to the hospital, including drivers license, insurance or credit cards.  [facility] is not responsible for any belongings or valuables.   TAKE THESE MEDICATIONS THE MORNING OF SURGERY:  none  Use CHG Soap as directed on instruction sheet.  Bring your C-PAP to the hospital  with you in case you may have to spend the night.   NOW!  Stop Anti-inflammatories (NSAIDS) such as Advil, Aleve, Ibuprofen, Motrin, Naproxen, Naprosyn and Aspirin based products such as Excedrin, Goodys Powder, BC Powder. (May take Tylenol or Acetaminophen if needed.)  NOW!  Stop ANY OVER THE COUNTER supplements until after surgery. (May continue Vitamin D, Vitamin B, and multivitamin.)  Wear comfortable clothing (specific to your surgery type) to the hospital.  Plan for stool softeners for home use.  If you are being admitted to the hospital overnight, leave your suitcase in the car. After surgery it may be brought to your room.  If you are being discharged the day of surgery, you will not be allowed to drive home. You will need a responsible adult to drive you home and stay with you that night.   If you are taking public transportation, you will need to have a responsible adult with you. Please confirm with your physician that it is acceptable to use public transportation.   Please call [phone] if you have any questions about these instructions.

## 2017-12-23 MED ORDER — CEFAZOLIN SODIUM-DEXTROSE 2-4 GM/100ML-% IV SOLN
2.0000 g | Freq: Once | INTRAVENOUS | Status: AC
  Administered 2017-12-24: 2 g via INTRAVENOUS

## 2017-12-24 ENCOUNTER — Encounter
Admission: RE | Disposition: A | Discharge status: Home / Self Care | Payer: Self-pay | Source: Ambulatory Visit | Attending: Obstetrics & Gynecology

## 2017-12-24 ENCOUNTER — Ambulatory Visit: Payer: Managed Care, Other (non HMO) | Admitting: Registered Nurse

## 2017-12-24 ENCOUNTER — Encounter: Payer: Self-pay | Admitting: *Deleted

## 2017-12-24 ENCOUNTER — Ambulatory Visit
Admission: RE | Admit: 2017-12-24 | Discharge: 2017-12-24 | Disposition: A | Discharge status: Home / Self Care | Payer: Managed Care, Other (non HMO) | Source: Ambulatory Visit | Attending: Obstetrics & Gynecology | Admitting: Obstetrics & Gynecology

## 2017-12-24 DIAGNOSIS — Q5279 Other congenital malformations of vulva: Secondary | ICD-10-CM | POA: Insufficient documentation

## 2017-12-24 DIAGNOSIS — Z885 Allergy status to narcotic agent status: Secondary | ICD-10-CM | POA: Diagnosis not present

## 2017-12-24 DIAGNOSIS — G4733 Obstructive sleep apnea (adult) (pediatric): Secondary | ICD-10-CM | POA: Insufficient documentation

## 2017-12-24 DIAGNOSIS — D251 Intramural leiomyoma of uterus: Secondary | ICD-10-CM | POA: Insufficient documentation

## 2017-12-24 DIAGNOSIS — I10 Essential (primary) hypertension: Secondary | ICD-10-CM | POA: Insufficient documentation

## 2017-12-24 DIAGNOSIS — N920 Excessive and frequent menstruation with regular cycle: Secondary | ICD-10-CM | POA: Diagnosis present

## 2017-12-24 DIAGNOSIS — Z91048 Other nonmedicinal substance allergy status: Secondary | ICD-10-CM | POA: Diagnosis not present

## 2017-12-24 DIAGNOSIS — N72 Inflammatory disease of cervix uteri: Secondary | ICD-10-CM | POA: Diagnosis not present

## 2017-12-24 DIAGNOSIS — Z8249 Family history of ischemic heart disease and other diseases of the circulatory system: Secondary | ICD-10-CM | POA: Insufficient documentation

## 2017-12-24 DIAGNOSIS — D259 Leiomyoma of uterus, unspecified: Secondary | ICD-10-CM | POA: Diagnosis present

## 2017-12-24 HISTORY — PX: BILATERAL SALPINGECTOMY: SHX5743

## 2017-12-24 HISTORY — PX: VULVECTOMY PARTIAL: SHX6187

## 2017-12-24 HISTORY — PX: VAGINAL HYSTERECTOMY: SHX2639

## 2017-12-24 LAB — POCT PREGNANCY, URINE: PREG TEST UR: NEGATIVE

## 2017-12-24 SURGERY — HYSTERECTOMY, VAGINAL
Anesthesia: General

## 2017-12-24 MED ORDER — ACETAMINOPHEN 650 MG RE SUPP
650.0000 mg | RECTAL | Status: DC | PRN
  Filled 2017-12-24: qty 1

## 2017-12-24 MED ORDER — SILVER SULFADIAZINE 1 % EX CREA
TOPICAL_CREAM | CUTANEOUS | Status: DC | PRN
  Administered 2017-12-24: 1 via TOPICAL

## 2017-12-24 MED ORDER — FAMOTIDINE 20 MG PO TABS
20.0000 mg | ORAL_TABLET | Freq: Once | ORAL | Status: AC
  Administered 2017-12-24: 20 mg via ORAL

## 2017-12-24 MED ORDER — FENTANYL CITRATE (PF) 100 MCG/2ML IJ SOLN
INTRAMUSCULAR | Status: DC | PRN
  Administered 2017-12-24 (×2): 100 ug via INTRAVENOUS

## 2017-12-24 MED ORDER — HYDROMORPHONE HCL 1 MG/ML IJ SOLN
INTRAMUSCULAR | Status: DC | PRN
  Administered 2017-12-24 (×2): 0.5 mg via INTRAVENOUS

## 2017-12-24 MED ORDER — BUPIVACAINE HCL (PF) 0.5 % IJ SOLN
INTRAMUSCULAR | Status: AC
  Filled 2017-12-24: qty 30

## 2017-12-24 MED ORDER — LIDOCAINE-EPINEPHRINE 1 %-1:100000 IJ SOLN
INTRAMUSCULAR | Status: DC | PRN
  Administered 2017-12-24: 18 mL

## 2017-12-24 MED ORDER — TRIAMCINOLONE ACETONIDE 40 MG/ML IJ SUSP
INTRAMUSCULAR | Status: DC | PRN
  Administered 2017-12-24: 40 mg

## 2017-12-24 MED ORDER — MORPHINE SULFATE (PF) 4 MG/ML IV SOLN: 1.0000 mg | INTRAVENOUS | Status: DC | PRN

## 2017-12-24 MED ORDER — HEPARIN SODIUM (PORCINE) 5000 UNIT/ML IJ SOLN
5000.0000 [IU] | Freq: Once | INTRAMUSCULAR | Status: AC
  Administered 2017-12-24: 5000 [IU] via SUBCUTANEOUS

## 2017-12-24 MED ORDER — BUPIVACAINE LIPOSOME 1.3 % IJ SUSP
INTRAMUSCULAR | Status: DC | PRN
  Administered 2017-12-24: 50 mL

## 2017-12-24 MED ORDER — LACTATED RINGERS IV SOLN
INTRAVENOUS | Status: DC
  Administered 2017-12-24: 07:00:00 via INTRAVENOUS

## 2017-12-24 MED ORDER — KETOROLAC TROMETHAMINE 30 MG/ML IJ SOLN
INTRAMUSCULAR | Status: AC
  Filled 2017-12-24: qty 1

## 2017-12-24 MED ORDER — IBUPROFEN 800 MG PO TABS: 800.0000 mg | ORAL_TABLET | Freq: Three times a day (TID) | ORAL | 1 refills | Status: DC | PRN

## 2017-12-24 MED ORDER — HYDROMORPHONE HCL 2 MG PO TABS
2.0000 mg | ORAL_TABLET | Freq: Two times a day (BID) | ORAL | Status: DC | PRN
  Filled 2017-12-24: qty 1

## 2017-12-24 MED ORDER — MEPERIDINE HCL 50 MG/ML IJ SOLN: 6.2500 mg | INTRAMUSCULAR | Status: DC | PRN

## 2017-12-24 MED ORDER — HYDROMORPHONE HCL 2 MG PO TABS
ORAL_TABLET | ORAL | Status: AC
  Filled 2017-12-24: qty 1

## 2017-12-24 MED ORDER — SUGAMMADEX SODIUM 200 MG/2ML IV SOLN
INTRAVENOUS | Status: DC | PRN
  Administered 2017-12-24: 200 mg via INTRAVENOUS

## 2017-12-24 MED ORDER — SILVER SULFADIAZINE 1 % EX CREA
TOPICAL_CREAM | CUTANEOUS | Status: AC
  Filled 2017-12-24: qty 85

## 2017-12-24 MED ORDER — LIDOCAINE-EPINEPHRINE 1 %-1:100000 IJ SOLN
INTRAMUSCULAR | Status: AC
  Filled 2017-12-24: qty 1

## 2017-12-24 MED ORDER — HYDROCORTISONE 2.5 % RE CREA: TOPICAL_CREAM | RECTAL | 2 refills | Status: AC

## 2017-12-24 MED ORDER — SEVOFLURANE IN SOLN
RESPIRATORY_TRACT | Status: AC
  Filled 2017-12-24: qty 250

## 2017-12-24 MED ORDER — CELECOXIB 200 MG PO CAPS
ORAL_CAPSULE | ORAL | Status: AC
  Administered 2017-12-24: 400 mg via ORAL
  Filled 2017-12-24: qty 2

## 2017-12-24 MED ORDER — ACETAMINOPHEN 500 MG PO TABS
ORAL_TABLET | ORAL | Status: AC
  Administered 2017-12-24: 1000 mg via ORAL
  Filled 2017-12-24: qty 2

## 2017-12-24 MED ORDER — KETOROLAC TROMETHAMINE 30 MG/ML IJ SOLN
INTRAMUSCULAR | Status: DC | PRN
  Administered 2017-12-24: 30 mg via INTRAVENOUS

## 2017-12-24 MED ORDER — TRIAMCINOLONE ACETONIDE 40 MG/ML IJ SUSP
INTRAMUSCULAR | Status: AC
  Filled 2017-12-24: qty 1

## 2017-12-24 MED ORDER — PROPOFOL 10 MG/ML IV BOLUS
INTRAVENOUS | Status: AC
  Filled 2017-12-24: qty 20

## 2017-12-24 MED ORDER — LIDOCAINE HCL (CARDIAC) PF 100 MG/5ML IV SOSY
PREFILLED_SYRINGE | INTRAVENOUS | Status: DC | PRN
  Administered 2017-12-24: 80 mg via INTRAVENOUS

## 2017-12-24 MED ORDER — FENTANYL CITRATE (PF) 100 MCG/2ML IJ SOLN: 25.0000 ug | INTRAMUSCULAR | Status: DC | PRN

## 2017-12-24 MED ORDER — CELECOXIB 200 MG PO CAPS
400.0000 mg | ORAL_CAPSULE | Freq: Once | ORAL | Status: AC
  Administered 2017-12-24: 400 mg via ORAL

## 2017-12-24 MED ORDER — FENTANYL CITRATE (PF) 100 MCG/2ML IJ SOLN
INTRAMUSCULAR | Status: AC
  Filled 2017-12-24: qty 2

## 2017-12-24 MED ORDER — ACETAMINOPHEN 500 MG PO TABS
1000.0000 mg | ORAL_TABLET | Freq: Once | ORAL | Status: AC
  Administered 2017-12-24: 1000 mg via ORAL

## 2017-12-24 MED ORDER — GABAPENTIN 300 MG PO CAPS
600.0000 mg | ORAL_CAPSULE | Freq: Once | ORAL | Status: AC
  Administered 2017-12-24: 600 mg via ORAL

## 2017-12-24 MED ORDER — DEXAMETHASONE SODIUM PHOSPHATE 10 MG/ML IJ SOLN
INTRAMUSCULAR | Status: DC | PRN
  Administered 2017-12-24: 4 mg via INTRAVENOUS

## 2017-12-24 MED ORDER — SUCCINYLCHOLINE CHLORIDE 20 MG/ML IJ SOLN
INTRAMUSCULAR | Status: AC
  Filled 2017-12-24: qty 1

## 2017-12-24 MED ORDER — ROCURONIUM BROMIDE 100 MG/10ML IV SOLN
INTRAVENOUS | Status: DC | PRN
  Administered 2017-12-24: 50 mg via INTRAVENOUS

## 2017-12-24 MED ORDER — FAMOTIDINE 20 MG PO TABS
ORAL_TABLET | ORAL | Status: AC
  Administered 2017-12-24: 20 mg via ORAL
  Filled 2017-12-24: qty 1

## 2017-12-24 MED ORDER — PROPOFOL 10 MG/ML IV BOLUS
INTRAVENOUS | Status: DC | PRN
  Administered 2017-12-24: 170 mg via INTRAVENOUS

## 2017-12-24 MED ORDER — GABAPENTIN 300 MG PO CAPS
ORAL_CAPSULE | ORAL | Status: AC
  Administered 2017-12-24: 600 mg via ORAL
  Filled 2017-12-24: qty 2

## 2017-12-24 MED ORDER — DIPHENHYDRAMINE HCL 50 MG/ML IJ SOLN
INTRAMUSCULAR | Status: AC
  Filled 2017-12-24: qty 1

## 2017-12-24 MED ORDER — PHENYLEPHRINE HCL 10 MG/ML IJ SOLN
INTRAMUSCULAR | Status: AC
  Filled 2017-12-24: qty 1

## 2017-12-24 MED ORDER — CEFAZOLIN SODIUM-DEXTROSE 2-4 GM/100ML-% IV SOLN
INTRAVENOUS | Status: AC
  Filled 2017-12-24: qty 100

## 2017-12-24 MED ORDER — HYDROMORPHONE HCL 1 MG/ML IJ SOLN
INTRAMUSCULAR | Status: AC
  Filled 2017-12-24: qty 1

## 2017-12-24 MED ORDER — DIPHENHYDRAMINE HCL 50 MG/ML IJ SOLN
INTRAMUSCULAR | Status: DC | PRN
  Administered 2017-12-24: 12.5 mg via INTRAVENOUS

## 2017-12-24 MED ORDER — MIDAZOLAM HCL 2 MG/2ML IJ SOLN
INTRAMUSCULAR | Status: DC | PRN
  Administered 2017-12-24: 2 mg via INTRAVENOUS

## 2017-12-24 MED ORDER — DEXAMETHASONE SODIUM PHOSPHATE 10 MG/ML IJ SOLN
INTRAMUSCULAR | Status: AC
  Filled 2017-12-24: qty 1

## 2017-12-24 MED ORDER — ROCURONIUM BROMIDE 50 MG/5ML IV SOLN
INTRAVENOUS | Status: AC
  Filled 2017-12-24: qty 1

## 2017-12-24 MED ORDER — HEPARIN SODIUM (PORCINE) 5000 UNIT/ML IJ SOLN
INTRAMUSCULAR | Status: AC
  Administered 2017-12-24: 5000 [IU] via SUBCUTANEOUS
  Filled 2017-12-24: qty 1

## 2017-12-24 MED ORDER — MIDAZOLAM HCL 2 MG/2ML IJ SOLN
INTRAMUSCULAR | Status: AC
  Filled 2017-12-24: qty 2

## 2017-12-24 MED ORDER — LIDOCAINE HCL (PF) 2 % IJ SOLN
INTRAMUSCULAR | Status: AC
  Filled 2017-12-24: qty 10

## 2017-12-24 MED ORDER — PROMETHAZINE HCL 25 MG/ML IJ SOLN: 6.2500 mg | INTRAMUSCULAR | Status: DC | PRN

## 2017-12-24 MED ORDER — ONDANSETRON HCL 4 MG/2ML IJ SOLN
INTRAMUSCULAR | Status: DC | PRN
  Administered 2017-12-24: 4 mg via INTRAVENOUS

## 2017-12-24 MED ORDER — LACTATED RINGERS IV SOLN: INTRAVENOUS | Status: DC

## 2017-12-24 MED ORDER — KETOROLAC TROMETHAMINE 30 MG/ML IJ SOLN
30.0000 mg | Freq: Four times a day (QID) | INTRAMUSCULAR | Status: DC
  Filled 2017-12-24: qty 1

## 2017-12-24 MED ORDER — HYDROMORPHONE HCL 2 MG PO TABS: 2.0000 mg | ORAL_TABLET | Freq: Two times a day (BID) | ORAL | 0 refills | Status: AC | PRN

## 2017-12-24 MED ORDER — BUPIVACAINE LIPOSOME 1.3 % IJ SUSP
INTRAMUSCULAR | Status: AC
  Filled 2017-12-24: qty 20

## 2017-12-24 MED ORDER — ACETAMINOPHEN 325 MG PO TABS: 650.0000 mg | ORAL_TABLET | ORAL | Status: DC | PRN

## 2017-12-24 SURGICAL SUPPLY — 63 items
BACTOSHIELD CHG 4% 4OZ (MISCELLANEOUS) ×1
BAG URINE DRAINAGE (UROLOGICAL SUPPLIES) ×4 IMPLANT
BLADE SURG SZ10 CARB STEEL (BLADE) ×4 IMPLANT
CANISTER SUCT 1200ML W/VALVE (MISCELLANEOUS) ×4 IMPLANT
CATH FOLEY 2WAY  5CC 16FR (CATHETERS) ×1
CATH URTH 16FR FL 2W BLN LF (CATHETERS) ×3 IMPLANT
COVER WAND RF STERILE (DRAPES) ×4 IMPLANT
DRAPE LAPAROTOMY 100X77 ABD (DRAPES) ×4 IMPLANT
DRAPE LAPAROTOMY TRNSV 106X77 (MISCELLANEOUS) ×4 IMPLANT
DRAPE PERI LITHO V/GYN (MISCELLANEOUS) ×4 IMPLANT
DRAPE SHEET LG 3/4 BI-LAMINATE (DRAPES) ×4 IMPLANT
DRAPE UNDER BUTTOCK W/FLU (DRAPES) ×4 IMPLANT
DRSG TELFA 3X8 NADH (GAUZE/BANDAGES/DRESSINGS) ×4 IMPLANT
ELECT BLADE 6 FLAT ULTRCLN (ELECTRODE) ×4 IMPLANT
ELECT CAUTERY BLADE 6.4 (BLADE) ×4 IMPLANT
ELECT REM PT RETURN 9FT ADLT (ELECTROSURGICAL) ×4
ELECTRODE REM PT RTRN 9FT ADLT (ELECTROSURGICAL) ×3 IMPLANT
GAUZE 4X4 16PLY RFD (DISPOSABLE) ×4 IMPLANT
GLOVE BIOGEL PI IND STRL 6.5 (GLOVE) ×3 IMPLANT
GLOVE BIOGEL PI INDICATOR 6.5 (GLOVE) ×1
GLOVE PI ORTHOPRO 6.5 (GLOVE) ×1
GLOVE PI ORTHOPRO STRL 6.5 (GLOVE) ×3 IMPLANT
GLOVE SURG SYN 6.5 ES PF (GLOVE) ×4 IMPLANT
GOWN STRL REUS W/ TWL LRG LVL3 (GOWN DISPOSABLE) ×9 IMPLANT
GOWN STRL REUS W/ TWL XL LVL3 (GOWN DISPOSABLE) ×3 IMPLANT
GOWN STRL REUS W/TWL LRG LVL3 (GOWN DISPOSABLE) ×3
GOWN STRL REUS W/TWL XL LVL3 (GOWN DISPOSABLE) ×1
HANDLE YANKAUER SUCT BULB TIP (MISCELLANEOUS) ×4 IMPLANT
IRRIGATION STRYKERFLOW (MISCELLANEOUS) IMPLANT
IRRIGATOR STRYKERFLOW (MISCELLANEOUS)
KIT TURNOVER CYSTO (KITS) ×4 IMPLANT
LABEL OR SOLS (LABEL) ×4 IMPLANT
LIGASURE IMPACT 36 18CM CVD LR (INSTRUMENTS) ×4 IMPLANT
NS IRRIG 1000ML POUR BTL (IV SOLUTION) IMPLANT
NS IRRIG 500ML POUR BTL (IV SOLUTION) ×4 IMPLANT
PACK BASIN MAJOR ARMC (MISCELLANEOUS) ×4 IMPLANT
PACK BASIN MINOR ARMC (MISCELLANEOUS) IMPLANT
PAD OB MATERNITY 4.3X12.25 (PERSONAL CARE ITEMS) ×4 IMPLANT
SCRUB CHG 4% DYNA-HEX 4OZ (MISCELLANEOUS) ×3 IMPLANT
SPONGE LAP 18X18 RF (DISPOSABLE) ×4 IMPLANT
STAPLER SKIN PROX 35W (STAPLE) IMPLANT
STRIP CLOSURE SKIN 1/2X4 (GAUZE/BANDAGES/DRESSINGS) ×4 IMPLANT
SUT CHROMIC 0 CT 1 (SUTURE) ×4 IMPLANT
SUT CHROMIC 0 CT 1 36 (SUTURE) ×8 IMPLANT
SUT CHROMIC 3 0 SH 27 (SUTURE) ×20 IMPLANT
SUT ETHIBOND CT1 BRD #0 30IN (SUTURE) IMPLANT
SUT ETHIBOND NAB CT1 #1 30IN (SUTURE) IMPLANT
SUT PDS 2-0 27IN (SUTURE) IMPLANT
SUT VIC AB 0 CT1 27 (SUTURE)
SUT VIC AB 0 CT1 27XCR 8 STRN (SUTURE) IMPLANT
SUT VIC AB 0 CT1 36 (SUTURE) ×4 IMPLANT
SUT VIC AB 1 CT1 36 (SUTURE) IMPLANT
SUT VIC AB 2-0 CT1 (SUTURE) IMPLANT
SUT VIC AB 3-0 SH 27 (SUTURE)
SUT VIC AB 3-0 SH 27X BRD (SUTURE) IMPLANT
SUT VIC AB 4-0 PS2 18 (SUTURE) ×4 IMPLANT
SUT VICRYL+ 3-0 36IN CT-1 (SUTURE) ×8 IMPLANT
SYR 10ML LL (SYRINGE) ×4 IMPLANT
SYR 30ML LL (SYRINGE) ×4 IMPLANT
SYR BULB IRRIG 60ML STRL (SYRINGE) ×4 IMPLANT
SYR CONTROL 10ML (SYRINGE) ×4 IMPLANT
TRAY FOLEY MTR SLVR 16FR STAT (SET/KITS/TRAYS/PACK) ×4 IMPLANT
TRAY PREP VAG/GEN (MISCELLANEOUS) ×4 IMPLANT

## 2017-12-24 NOTE — Discharge Instructions (Signed)
Discharge instructions:  Call office if you have any of the following: fever >101 F, chills, shortness of breath, excessive vaginal bleeding, incision drainage or problems, leg pain or redness, or any other concerns.   Activity: Do not lift > 10 lbs for 8 weeks.  No intercourse or tampons for 8 weeks.  No driving for 1-2 weeks, or until you feel you can slam on the brakes instinctively.   Take 800mg  Ibuprofen and 1000mg  Tylenol around the clock, every 6 hours for at least the first 3-5 days.  After this you can take as needed.  This will help decrease inflammation and promote healing.  The narcotics you'll take every 4-6 hours as needed, as they just trick your brain into thinking its not in pain.    Please don't limit yourself in terms of routine activity.  You will be able to do most things, although they may take longer to do or be a little painful.  You can do it!  Keep those frozen peas on your vulva for the first 48 hours to minimize swelling.    Get some witch hazel pads (Tucks medicated pads) and apply they hydrocortisone cream to these, place them on the vulva where the stitches are.  Together they will decrease swelling and increase healing.  Don't be a hero, but don't be a wimp either!    AMBULATORY SURGERY  DISCHARGE INSTRUCTIONS   1) The drugs that you were given will stay in your system until tomorrow so for the next 24 hours you should not:  A) Drive an automobile B) Make any legal decisions C) Drink any alcoholic beverage   2) You may resume regular meals tomorrow.  Today it is better to start with liquids and gradually work up to solid foods.  You may eat anything you prefer, but it is better to start with liquids, then soup and crackers, and gradually work up to solid foods.   3) Please notify your doctor immediately if you have any unusual bleeding, trouble breathing, redness and pain at the surgery site, drainage, fever, or pain not relieved by  medication.    4) Additional Instructions: TAKE A STOOL SOFTENER TWICE A DAY WHILE TAKING NARCOTIC PAIN MEDICINE TO PREVENT CONSTIPATION   Please contact your physician with any problems or Same Day Surgery at [phone], Monday through Friday 6 am to 4 pm, or [place] [facility] number at [phone].

## 2017-12-24 NOTE — H&P (Signed)
Preoperative History and Physical  Amber Coleman is a 42 y.o. [id number] here for surgical management of menorrhagia and congenital abnormality of the vulva.   No significant preoperative concerns.  Proposed surgery: total vaginal hysterectomy, bilateral salpingectomy, partial vulvectomy  Past Medical History:  Diagnosis Date  . Anxiety   . GERD (gastroesophageal reflux disease)   . Hypertension    situational; no longer needs medication  . Menorrhagia   . Obstructive sleep apnea    uses CPAP   Past Surgical History:  Procedure Laterality Date  . BREAST ENHANCEMENT SURGERY Bilateral 05/2017  . BUNIONECTOMY Left 05/13/2016  . BUNIONECTOMY Right 12/2016  . LIPOSUCTION  06/2016  . wisdom teeth   1996   OB History  Gravida Para Term Preterm AB Living  2 2 2     2   SAB TAB Ectopic Multiple Live Births          2    # Outcome Date GA Lbr Len/2nd Weight Sex Delivery Anes PTL Lv  2 Term 07/27/07    F Vag-Spont   LIV  1 Term 06/30/03    F Vag-Spont   LIV  Patient denies any other pertinent gynecologic issues.   No current facility-administered medications on file prior to encounter.    Current Outpatient Medications on File Prior to Encounter  Medication Sig Dispense Refill  . Cholecalciferol (VITAMIN D3) 5000 units CAPS Take 1 capsule by mouth daily.    . magnesium oxide (MAG-OX) 400 MG tablet Take 400 mg by mouth daily.    . Menaquinone-7 (VITAMIN K2 PO) Take 1 tablet by mouth daily.    . Pregnenolone Micronized POWD Take 1 tablet by mouth daily.     . vitamin C (ASCORBIC ACID) 500 MG tablet Take 500 mg by mouth daily.     Allergies  Allergen Reactions  . Tape Itching and Other (See Comments)    Itchy, redness and blisters  . Oxycodone-Acetaminophen Rash    Social History:   reports that she has never smoked. She has never used smokeless tobacco. She reports that she drinks about 6.0 standard drinks of alcohol per week. She reports that she does not use  drugs.  Family History  Problem Relation Age of Onset  . Diabetes Mother   . Heart disease Father   . Heart disease Sister   . Hypertension Sister   . Anxiety disorder Sister   . Thyroid disease Sister   . Alcoholism Sister   . Anxiety disorder Brother   . Anxiety disorder Sister   . Anxiety disorder Brother   . Multiple sclerosis Brother   . Lung cancer Maternal Grandfather   . Diabetes Paternal Grandfather   . Pancreatic cancer Paternal Grandfather     Review of Systems: Noncontributory  PHYSICAL EXAM: Blood pressure (!) 152/102, pulse 78, temperature 97.8 F (36.6 C), temperature source Tympanic, resp. rate 16, last menstrual period 11/28/2017, SpO2 100 %. General appearance - alert, well appearing, and in no distress Chest - clear to auscultation, no wheezes, rales or rhonchi, symmetric air entry Heart - normal rate and regular rhythm Abdomen - soft, nontender, nondistended, no masses or organomegaly Pelvic - examination not indicated Extremities - peripheral pulses normal, no pedal edema, no clubbing or cyanosis  Labs: Results for orders placed or performed during the hospital encounter of 12/24/17 (from the past 336 hour(s))  Pregnancy, urine POC   Collection Time: 12/24/17  6:15 AM  Result Value Ref Range   Preg Test, Ur NEGATIVE  NEGATIVE  ABO/Rh   Collection Time: 12/24/17  6:35 AM  Result Value Ref Range   ABO/RH(D)      O NEG Performed at [facility], [address] [place], [place] [address]    Weak D PENDING   Results for orders placed or performed during the hospital encounter of 12/16/17 (from the past 336 hour(s))  CBC   Collection Time: 12/16/17 10:33 AM  Result Value Ref Range   WBC 7.1 4.0 - 10.5 K/uL   RBC 4.22 3.87 - 5.11 MIL/uL   Hemoglobin 13.1 12.0 - 15.0 g/dL   HCT 39.6 36.0 - 46.0 %   MCV 93.8 80.0 - 100.0 fL   MCH 31.0 26.0 - 34.0 pg   MCHC 33.1 30.0 - 36.0 g/dL   RDW 13.0 11.5 - 15.5 %   Platelets 196 150 - 400 K/uL    nRBC 0.0 0.0 - 0.2 %  Basic metabolic panel   Collection Time: 12/16/17 10:33 AM  Result Value Ref Range   Sodium 136 135 - 145 mmol/L   Potassium 3.8 3.5 - 5.1 mmol/L   Chloride 103 98 - 111 mmol/L   CO2 23 22 - 32 mmol/L   Glucose, Bld 116 (H) 70 - 99 mg/dL   BUN 7 6 - 20 mg/dL   Creatinine, Ser 0.54 0.44 - 1.00 mg/dL   Calcium 9.4 8.9 - 10.3 mg/dL   GFR calc non Af Amer >60 >60 mL/min   GFR calc Af Amer >60 >60 mL/min   Anion gap 10 5 - 15  Type and screen [place]   Collection Time: 12/16/17 10:33 AM  Result Value Ref Range   ABO/RH(D) O NEG    Antibody Screen NEG    Sample Expiration 12/30/2017    Extend sample reason NO TRANSFUSIONS OR PREGNANCY IN THE PAST 3 MONTHS    Weak D POS    DAT, IgG      NEG Performed at [facility], [address], [place], [place] [address]     Imaging Studies: No results found.  Assessment: Patient Active Problem List   Diagnosis Date Noted  . Menorrhagia 06/17/2016  . Awareness of heartbeats 03/01/2015  . Hypertension 01/01/2015  . 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 Acid reflux 08/25/2014  . 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 Apnea, sleep 08/25/2014  . Hypertrophic condition of skin 08/25/2014  . 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   The risks of surgery were discussed in detail with the patient including but not limited to: bleeding which may require transfusion or reoperation; infection which may require antibiotics; injury to surrounding organs which may involve bowel, bladder, ureters ; need for additional procedures including laparoscopy or laparotomy; thromboembolic phenomenon, surgical site problems and other postoperative/anesthesia complications. Likelihood of success in alleviating the patient's condition was discussed. Routine postoperative instructions will be reviewed with the  patient and her family in detail after surgery.  The patient concurred with the proposed plan, giving informed written consent for the surgery.  Patient has been NPO since last night she will remain NPO for procedure.  Anesthesia and OR aware.  Preoperative prophylactic antibiotics and SCDs ordered on call to the OR.  To OR when ready.   ----- Larey Days, MD Attending Obstetrician and Gynecologist [facility], Department of [facility]

## 2017-12-24 NOTE — Transfer of Care (Signed)
Immediate Anesthesia Transfer of Care Note  Patient: Amber Coleman  Procedure(s) Performed: HYSTERECTOMY VAGINAL (N/A ) BILATERAL SALPINGECTOMY (Bilateral ) VULVECTOMY PARTIAL  Patient Location: PACU  Anesthesia Type:General  Level of Consciousness: awake  Airway & Oxygen Therapy: Patient Spontanous Breathing  Post-op Assessment: Report given to RN  Post vital signs: stable  Last Vitals:  Vitals Value Taken Time  BP 157/90 12/24/2017 10:29 AM  Temp 37.1 C 12/24/2017 10:29 AM  Pulse 76 12/24/2017 10:33 AM  Resp 9 12/24/2017 10:33 AM  SpO2 100 % 12/24/2017 10:33 AM  Vitals shown include unvalidated device data.  Last Pain:  Vitals:   12/24/17 0623  TempSrc: Tympanic  PainSc: 0-No pain         Complications: No apparent anesthesia complications

## 2017-12-24 NOTE — Anesthesia Procedure Notes (Signed)
Procedure Name: Intubation Date/Time: 12/24/2017 7:35 AM Performed by: Geraldine Contras, CRNA Pre-anesthesia Checklist: Patient identified, Emergency Drugs available, Suction available, Patient being monitored and Timeout performed Patient Re-evaluated:Patient Re-evaluated prior to induction Oxygen Delivery Method: Circle system utilized Preoxygenation: Pre-oxygenation with 100% oxygen Induction Type: IV induction Ventilation: Mask ventilation without difficulty Laryngoscope Size: Mac and 3 Grade View: Grade I Tube type: Oral Tube size: 7.0 mm Number of attempts: 1 Airway Equipment and Method: Stylet Placement Confirmation: ETT inserted through vocal cords under direct vision,  positive ETCO2 and breath sounds checked- equal and bilateral Secured at: 21 cm Tube secured with: Tape Dental Injury: Teeth and Oropharynx as per pre-operative assessment

## 2017-12-24 NOTE — Anesthesia Preprocedure Evaluation (Addendum)
Anesthesia Evaluation  Patient identified by MRN, date of birth, ID band Patient awake    Reviewed: Allergy & Precautions, NPO status , Patient's Chart, lab work & pertinent test results  History of Anesthesia Complications Negative for: history of anesthetic complications  Airway Mallampati: II  TM Distance: >3 FB Neck ROM: Full    Dental no notable dental hx.    Pulmonary neg sleep apnea, neg COPD,    breath sounds clear to auscultation- rhonchi (-) wheezing      Cardiovascular hypertension, (-) CAD, (-) Past MI, (-) Cardiac Stents and (-) CABG  Rhythm:Regular Rate:Normal - Systolic murmurs and - Diastolic murmurs    Neuro/Psych PSYCHIATRIC DISORDERS Anxiety negative neurological ROS     GI/Hepatic Neg liver ROS, GERD  ,  Endo/Other  negative endocrine ROSneg diabetes  Renal/GU negative Renal ROS     Musculoskeletal negative musculoskeletal ROS (+)   Abdominal (+) - obese,   Peds  Hematology negative hematology ROS (+)   Anesthesia Other Findings Past Medical History: No date: Anxiety No date: GERD (gastroesophageal reflux disease) No date: Hypertension     Comment:  situational; no longer needs medication No date: Menorrhagia No date: Obstructive sleep apnea     Comment:  uses CPAP   Reproductive/Obstetrics                             Anesthesia Physical Anesthesia Plan  ASA: II  Anesthesia Plan: General   Post-op Pain Management:    Induction: Intravenous  PONV Risk Score and Plan: 2 and Dexamethasone, Ondansetron and Midazolam  Airway Management Planned: Oral ETT  Additional Equipment:   Intra-op Plan:   Post-operative Plan: Extubation in OR  Informed Consent: I have reviewed the patients History and Physical, chart, labs and discussed the procedure including the risks, benefits and alternatives for the proposed anesthesia with the patient or authorized  representative who has indicated his/her understanding and acceptance.   Dental advisory given  Plan Discussed with: CRNA and Anesthesiologist  Anesthesia Plan Comments:         Anesthesia Quick Evaluation

## 2017-12-24 NOTE — Anesthesia Postprocedure Evaluation (Signed)
Anesthesia Post Note  Patient: Amber Coleman  Procedure(s) Performed: HYSTERECTOMY VAGINAL (N/A ) BILATERAL SALPINGECTOMY (Bilateral ) VULVECTOMY PARTIAL  Patient location during evaluation: PACU Anesthesia Type: General Level of consciousness: awake and alert and oriented Pain management: pain level controlled Vital Signs Assessment: post-procedure vital signs reviewed and stable Respiratory status: spontaneous breathing, nonlabored ventilation and respiratory function stable Cardiovascular status: blood pressure returned to baseline and stable Postop Assessment: no signs of nausea or vomiting Anesthetic complications: no     Last Vitals:  Vitals:   12/24/17 1145 12/24/17 1222  BP: (!) 148/90 (!) 154/90  Pulse: 68 69  Resp: 16   Temp:    SpO2: 99% 99%    Last Pain:  Vitals:   12/24/17 1222  TempSrc:   PainSc: 5                  [name]

## 2017-12-24 NOTE — Anesthesia Post-op Follow-up Note (Signed)
Anesthesia QCDR form completed.        

## 2017-12-24 NOTE — Op Note (Signed)
Operative Note Procedure Date: 12/24/2017  Patient:  Amber Coleman  42 y.o. female  PRE-OPERATIVE DIAGNOSIS:  menorrhagia, fibroid uterus, congenital abnormality of the vulva  POST-OPERATIVE DIAGNOSIS:  menorrhagia, fibroid uterus, congenital abnormality of the vulva   PROCEDURE:  Procedure(s): HYSTERECTOMY VAGINAL (N/A) BILATERAL SALPINGECTOMY (Bilateral) VULVECTOMY PARTIAL  SURGEON:  Surgeon(s) and Role:    * Ward, Honor Loh, MD - Primary    * Schermerhorn, Gwen Her, MD - Assisting  ANESTHESIA:  General via ET  I/O  Total I/O In: 900 [I.V.:900] Out: 50 [Blood:50] 300cc urine  FINDINGS:  Top sized normal uterus, normal ovaries and fallopian tubes bilaterally.  Abnormally large bilateral labia minora.   SPECIMEN: Uterus, Cervix, and bilateral fallopian tubes  COMPLICATIONS: none apparent  DISPOSITION: vital signs stable to PACU  Indication for Surgery: 42 y.o. [id number] with heavy periods and unsatisfactory response and toleration of hormonal manipulation who requested definitive management with hysterectomy.  She also requested revision of her excessive labia while under anesthesia.  Risks of surgery were discussed with the patient including but not limited to: bleeding which may require transfusion or reoperation; infection which may require antibiotics; injury to bowel, bladder, ureters or other surrounding organs; need for additional procedures including laparotomy, blood clot, incisional problems and other postoperative/anesthesia complications. Written informed consent was obtained.      PROCEDURE IN DETAIL:  The patient had 5000u Heparin Sub-q and sequential compression devices applied to her lower extremities while in the preoperative area.  She was then taken to the operating room. IV antibiotics were given. General anesthesia was administered via endotracheal route.  She was placed in the dorsal lithotomy position, and was prepped and draped in a sterile manner. A  surgical time-out was performed.  A Foley catheter was inserted into her bladder, which was drained.   PROCEDURE IN DETAIL:  The patient had sequential compression devices applied to her lower extremities while in the preoperative area.  She was then taken to the operating room. IV antibiotics were given. General anesthesia was administered via endotracheal route.  She was placed in the dorsal lithotomy position, and was prepped and draped in a sterile manner. A surgical time-out was performed.  A red rubber catheter was inserted into her bladder, and no urine was drained.   The weighted speculum was inserted into the vagina, and thyroid clamps were placed on the anterior and posterior cervix.  20cc of lidocaine and epinephrine was injected circumferentially into and around the cervix.  The posterior vagina was grasped with an Alis clamp and scissors were used to enter the posterior peritoneum.  The long billed weighted speculum was then inserted above the rectum.  The Ligasure was then used to ligate and transect the bilateral  uterosacral ligaments.  The bovie was used to divide the vaginal mucosa from the cervix, and the tissue plane developed to identify the vesicouterine peritoneum.  This was divided with metzenbaums and the bladder retracted from the field.  Sequential bites of the ligasure transected the bilateral parametria, cardinal ligaments, uterine vessels, round ligaments and cornua.  The uterus and cervix were handed off to nursing, and the pedicles were hemostatic.  The bilateral tubes were grasped with babcocks and the ligasure was used to divide the mesosalpinx, and each tube was removed including the fimbriated end.  Hemostasis was noted at all pedicles.   The uterosacral ligaments were then reapproximated with two interrupted bites of 2-0 vicryl in a modified McCalls procedure.   The vaginal mucosa closed  anterior to posterior with interrupted firgure-of-eight stitches of 0-chromic with  occasional 0-vicryl ties as well.  Long-acting bupivacaine was injected into the vaginal mucosa and underlying tissues via a bilateral pudendal block.   The attention was turned to the external vulva. Bilateral Z-shaped outlines of the incision sites were made. 1% Lidocaine was injected into the underlying skin. A 10 blade was used to incise the skin and undermine the skin through the dermal layer. Bovie cauterization was then carried out to bleeding vessels and tissues and hemostasis was observed. The divided skin was then reapproximated with interrupted chromic stitches from apex to apex. The incision was hemostatic.  The perineal body was divided in a V-shape and a crown stitch was made.  3-0 Vicryl was used to reapproximate the ends of the bulbocavernosus muscles. The skin of the perineum was reapproximated. Kenalog was injected into each suture line.Silvadene  was placed over top of the incision at the conclusion of the procedure.  The patient tolerated the procedure well.  All instruments, needles, and sponge counts were correct x 2. The patient was taken to the recovery room in stable condition.   ----- Larey Days, MD Attending Obstetrician and Gynecologist [facility], Department of [facility]

## 2017-12-25 ENCOUNTER — Encounter: Payer: Self-pay | Admitting: Obstetrics & Gynecology

## 2017-12-25 LAB — ABO/RH
ABO/RH(D): O NEG
DAT, IGG: NEGATIVE
Weak D: POSITIVE

## 2017-12-28 LAB — SURGICAL PATHOLOGY

## 2018-06-29 DIAGNOSIS — Z8249 Family history of ischemic heart disease and other diseases of the circulatory system: Secondary | ICD-10-CM | POA: Insufficient documentation

## 2018-06-29 DIAGNOSIS — I447 Left bundle-branch block, unspecified: Secondary | ICD-10-CM | POA: Insufficient documentation

## 2018-09-29 DIAGNOSIS — S76312A Strain of muscle, fascia and tendon of the posterior muscle group at thigh level, left thigh, initial encounter: Secondary | ICD-10-CM | POA: Insufficient documentation

## 2019-05-08 ENCOUNTER — Ambulatory Visit (INDEPENDENT_AMBULATORY_CARE_PROVIDER_SITE_OTHER): Payer: BLUE CROSS/BLUE SHIELD

## 2019-05-08 ENCOUNTER — Other Ambulatory Visit: Payer: Self-pay

## 2019-05-08 DIAGNOSIS — L719 Rosacea, unspecified: Secondary | ICD-10-CM

## 2019-05-08 DIAGNOSIS — L578 Other skin changes due to chronic exposure to nonionizing radiation: Secondary | ICD-10-CM

## 2019-05-08 DIAGNOSIS — I781 Nevus, non-neoplastic: Secondary | ICD-10-CM

## 2019-05-09 NOTE — Progress Notes (Signed)
Pt presents today to discuss options for rosacea/photo-damage. BBL was recommended in a series of 3-5  Treatments spaced 4-6 weeks apart. She understands that Rosacea is a chronic condition, but can be improved with tx's. Advised no sun prior to the procedure. Recommended waiting until fall to begin tx. jj

## 2019-05-16 ENCOUNTER — Ambulatory Visit: Payer: BLUE CROSS/BLUE SHIELD

## 2019-09-05 ENCOUNTER — Ambulatory Visit: Payer: BLUE CROSS/BLUE SHIELD | Admitting: Dermatology

## 2020-01-30 ENCOUNTER — Ambulatory Visit: Payer: BLUE CROSS/BLUE SHIELD | Attending: Orthopedic Surgery | Admitting: Physical Therapy

## 2020-01-30 ENCOUNTER — Encounter: Payer: Self-pay | Admitting: Physical Therapy

## 2020-01-30 ENCOUNTER — Other Ambulatory Visit: Payer: Self-pay

## 2020-01-30 DIAGNOSIS — M25562 Pain in left knee: Secondary | ICD-10-CM | POA: Insufficient documentation

## 2020-01-30 DIAGNOSIS — M25561 Pain in right knee: Secondary | ICD-10-CM

## 2020-01-30 DIAGNOSIS — M25661 Stiffness of right knee, not elsewhere classified: Secondary | ICD-10-CM | POA: Diagnosis present

## 2020-01-30 NOTE — Therapy (Addendum)
[facility] [address], [place], [address] Phone: [phone]   Fax:  [phone]  Physical Therapy Evaluation  Patient Details  Name: Amber Coleman MRN: [id number] Date of Birth: Feb 17, 1975 No data recorded  Encounter Date: 01/30/2020    Past Medical History:  Diagnosis Date  . Anxiety   . GERD (gastroesophageal reflux disease)   . Hypertension    situational; no longer needs medication  . Menorrhagia   . Obstructive sleep apnea    uses CPAP    Past Surgical History:  Procedure Laterality Date  . BILATERAL SALPINGECTOMY Bilateral 12/24/2017   Procedure: BILATERAL SALPINGECTOMY;  Surgeon: Ward, Honor Loh, MD;  Location: [facility] ORS;  Service: Gynecology;  Laterality: Bilateral;  . BREAST ENHANCEMENT SURGERY Bilateral 05/2017  . BUNIONECTOMY Left 05/13/2016  . BUNIONECTOMY Right 12/2016  . LIPOSUCTION  06/2016  . VAGINAL HYSTERECTOMY N/A 12/24/2017   Procedure: HYSTERECTOMY VAGINAL;  Surgeon: Ward, Honor Loh, MD;  Location: [facility] ORS;  Service: Gynecology;  Laterality: N/A;  . VULVECTOMY PARTIAL  12/24/2017   Procedure: VULVECTOMY PARTIAL;  Surgeon: Ward, Honor Loh, MD;  Location: [facility] ORS;  Service: Gynecology;;  . wisdom teeth   1996    There were no vitals filed for this visit.       OBJECTIVE  MUSCULOSKELETAL: Tremor: Absent Bulk: Normal Tone: Normal, no spasticity, rigidity, or clonus No trophic changes noted to lower extremities. No ecchymosis, erythema, or edema noted around knee. No gross knee deformity noted  Posture No gross abnormalities noted in standing or seated posture  Lumbar/Hip AROM: WFL and painless with overpressure in all planes  Gait No gross deficits in gait identified  Palpation TTP with gentle L patellar mobilization, at patellar tendon and bicep femoris long head  Strength R/L 5/5 Hip flexion 5/5 Hip external rotation 5/5 Hip internal rotation 4/4  Hip extension  5/5 Hip abduction 5/5 Hip adduction 5/5 Knee extension 5/5 Knee flexion 5/5 Ankle Dorsiflexion 5/5 Ankle Plantarflexion 5/5 Ankle Inversion 5/5 Ankle Eversion *indicates pain  AROM  Knee R/L Flexion: 130/130  Extension: 0/0 *indicates pain  All hip and ankle motions WNL   Muscle Length Hamstring length: L 10d R 0-5deg negligible  Quad length Pat Patrick): WNL bilat Hip Flexor length Marcello Moores): WNL bilat IT band length Nicoletta Dress): WNL bilat  Passive Accessory Motion Superior Tibiofibular Joint: WNL Knee: Increased laxity of L knee into ext d/t ACL tear Patella: normal; painful on L Ankle: WNL bilat  NEUROLOGICAL:  Mental Status Patient is oriented to person, place and time.  Recent memory is intact.  Remote memory is intact.  Attention span and concentration are intact.  Expressive speech is intact.  Patient's fund of knowledge is within normal limits for educational level.  Sensation Grossly intact to light touch bilateral LEs as determined by testing dermatomes L2-S2 Proprioception and hot/cold testing deferred on this date  SPECIAL TESTS  Ligamentous Stability  Anterior Drawer Test: Positive RLE only  Lachman Test: Positive RLE only  Posterior Drawer Test: Negative bilat Posterior Sag Sign: Negative  Valgus Stress Test: Negative  Varus Stress Test:Negative bilat  SPECIAL TESTS  Motor Control: SLS 30sec bilat; on foam L: 10sec R 30sec Step down/up assessment: normal bilat Squat assessment: Good squat mechanics with good ankle mobility and knee stability throughout  Ther-Ex PT reviewed the following HEP with patient with patient able to demonstrate a set of the following with min cuing for correction needed. PT educated patient on parameters  of therex (how/when to inc/decrease intensity, frequency, rep/set range, stretch hold time, and purpose of therex) with verbalized understanding.  Access Code: [email or web address] SL bridge - 1 x daily - 7 x weekly - 3  sets - 5 reps SLR with bluTB  - 1 x daily - 7 x weekly - 3 sets - 10 reps Hamstring stretch - 1 x daily - 7 x weekly - 30-60sec hold Biking 3-5x week                 Objective measurements completed on examination: See above findings.                 PT Short Term Goals - 03/21/20 1535      PT SHORT TERM GOAL #1   Title Pt will be independent with HEP in order to maintain current strength to further progress therex following rehab protocol.    Baseline 03/21/20    Time 4    Period Weeks    Status New             PT Long Term Goals - 03/21/20 1536      PT LONG TERM GOAL #1   Title Pt will decrease worst pain as reported on NPRS by at least 3 points in order to demonstrate clinically significant reduction in knee pain    Baseline 03/21/20 8/10    Time 8    Period Weeks    Status New      PT LONG TERM GOAL #2   Title Pt will increase AROM of the knee to WNL in order to complete functional ADLs, sqautting, and normalize gait and stair negotiation    Baseline 03/21/20 Flex 40 ext 4    Time 8    Period Weeks    Status New      PT LONG TERM GOAL #3   Title Pt will demonstrate a 1 RM leg press to 75# for the right knee to demonstrate increased strength in order to complete heavy household ADLs and community participation.    Baseline 03/21/20 unable to complete resistance measure    Time 8    Period Weeks    Status New      PT LONG TERM GOAL #4   Title Patient will demonstrate a 1 RM knee ext of 45# for the right knee in order demonstrate increased strength in order to participate in heavy household ADLs and community participation.    Baseline 05/08/19 unable to complete resistance measure    Time 8    Period Weeks    Status New      PT LONG TERM GOAL #5   Title Patient will increase FOTO score to 67 to demonstrate predicted increase in functional mobility to complete ADLs    Baseline 03/21/20 47    Time 8    Period Weeks    Status New                    Patient will benefit from skilled therapeutic intervention in order to improve the following deficits and impairments:  Abnormal gait,Decreased activity tolerance,Decreased endurance,Decreased balance,Decreased mobility,Difficulty walking,Impaired flexibility,Impaired tone,Postural dysfunction,Decreased coordination,Decreased range of motion,Decreased strength,Increased fascial restricitons,Improper body mechanics,Pain  Visit Diagnosis: Acute pain of right knee  Stiffness of right knee, not elsewhere classified - Plan: PT plan of care cert/re-cert     Problem List Patient Active Problem List   Diagnosis Date Noted  . Menorrhagia 06/17/2016  . Awareness of heartbeats 03/01/2015  .  Hypertension 01/01/2015  . 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 Acid reflux 08/25/2014  . 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 Apnea, sleep 08/25/2014  . Hypertrophic condition of skin 08/25/2014  . 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 [place], [address] Phone: [phone]   Fax:  [phone]  Name: Amber Coleman MRN: [id number] Date of Birth: Jan 01, 1976

## 2020-02-06 ENCOUNTER — Other Ambulatory Visit: Payer: Self-pay

## 2020-02-06 ENCOUNTER — Encounter: Payer: Self-pay | Admitting: Physical Therapy

## 2020-02-06 ENCOUNTER — Ambulatory Visit: Payer: BLUE CROSS/BLUE SHIELD | Admitting: Physical Therapy

## 2020-02-06 DIAGNOSIS — M25661 Stiffness of right knee, not elsewhere classified: Secondary | ICD-10-CM

## 2020-02-06 DIAGNOSIS — M25561 Pain in right knee: Secondary | ICD-10-CM

## 2020-02-06 DIAGNOSIS — M25562 Pain in left knee: Secondary | ICD-10-CM | POA: Diagnosis not present

## 2020-02-06 NOTE — Therapy (Addendum)
[facility] [address] [place], [address] Phone: [phone]   Fax:  [phone]  Physical Therapy Treatment  Patient Details  Name: Amber Coleman MRN: [id number] Date of Birth: 06/19/1975 No data recorded  Encounter Date: 02/06/2020    Past Medical History:  Diagnosis Date  . Anxiety   . GERD (gastroesophageal reflux disease)   . Hypertension    situational; no longer needs medication  . Menorrhagia   . Obstructive sleep apnea    uses CPAP    Past Surgical History:  Procedure Laterality Date  . BILATERAL SALPINGECTOMY Bilateral 12/24/2017   Procedure: BILATERAL SALPINGECTOMY;  Surgeon: Ward, Honor Loh, MD;  Location: [facility] ORS;  Service: Gynecology;  Laterality: Bilateral;  . BREAST ENHANCEMENT SURGERY Bilateral 05/2017  . BUNIONECTOMY Left 05/13/2016  . BUNIONECTOMY Right 12/2016  . LIPOSUCTION  06/2016  . VAGINAL HYSTERECTOMY N/A 12/24/2017   Procedure: HYSTERECTOMY VAGINAL;  Surgeon: Ward, Honor Loh, MD;  Location: [facility] ORS;  Service: Gynecology;  Laterality: N/A;  . VULVECTOMY PARTIAL  12/24/2017   Procedure: VULVECTOMY PARTIAL;  Surgeon: Ward, Honor Loh, MD;  Location: [facility] ORS;  Service: Gynecology;;  . wisdom teeth   1996    There were no vitals filed for this visit.    Ther-Ex Nustep L4 75mins for gentle quad strengthening/activation  SLR x10 with cuing for quad activation at each set with good carry over; with 3# AW 2 x10 Sidelying hip abd 3# AW 3x 10 with min cuing for set up good carry over following Bridge with ball squeeze 2x 10  L SL heel raise 2x 10 with min cuing for eccentric control with good carry over Mini squat to mat table x10 LLE SL squat to elevated mat table 2x 10 with good carry over of technique following demo and minimal cuing.  LLE FWD lunge x10; on bosu ball 2x 10 with min cuing for technique initially with good carry over following L SLS on foam  30sec Hamstring stretch 30sec                       PT Short Term Goals - 03/21/20 1535      PT SHORT TERM GOAL #1   Title Pt will be independent with HEP in order to maintain current strength to further progress therex following rehab protocol.    Baseline 03/21/20    Time 4    Period Weeks    Status New             PT Long Term Goals - 03/21/20 1536      PT LONG TERM GOAL #1   Title Pt will decrease worst pain as reported on NPRS by at least 3 points in order to demonstrate clinically significant reduction in knee pain    Baseline 03/21/20 8/10    Time 8    Period Weeks    Status New      PT LONG TERM GOAL #2   Title Pt will increase AROM of the knee to WNL in order to complete functional ADLs, sqautting, and normalize gait and stair negotiation    Baseline 03/21/20 Flex 40 ext 4    Time 8    Period Weeks    Status New      PT LONG TERM GOAL #3   Title Pt will demonstrate a 1 RM leg press to 75# for the right knee to demonstrate increased strength in order to  complete heavy household ADLs and community participation.    Baseline 03/21/20 unable to complete resistance measure    Time 8    Period Weeks    Status New      PT LONG TERM GOAL #4   Title Patient will demonstrate a 1 RM knee ext of 45# for the right knee in order demonstrate increased strength in order to participate in heavy household ADLs and community participation.    Baseline 05/08/19 unable to complete resistance measure    Time 8    Period Weeks    Status New      PT LONG TERM GOAL #5   Title Patient will increase FOTO score to 67 to demonstrate predicted increase in functional mobility to complete ADLs    Baseline 03/21/20 47    Time 8    Period Weeks    Status New                  Patient will benefit from skilled therapeutic intervention in order to improve the following deficits and impairments:  Abnormal gait,Decreased activity tolerance,Decreased endurance,Decreased  balance,Decreased mobility,Difficulty walking,Impaired flexibility,Impaired tone,Postural dysfunction,Decreased coordination,Decreased range of motion,Decreased strength,Increased fascial restricitons,Improper body mechanics,Pain  Visit Diagnosis: Acute pain of right knee  Stiffness of right knee, not elsewhere classified     Problem List Patient Active Problem List   Diagnosis Date Noted  . Menorrhagia 06/17/2016  . Awareness of heartbeats 03/01/2015  . Hypertension 01/01/2015  . 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 Acid reflux 08/25/2014  . 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 Apnea, sleep 08/25/2014  . Hypertrophic condition of skin 08/25/2014  . 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 [place], [address] Phone: [phone]   Fax:  [phone]  Name: Amber Coleman MRN: [id number] Date of Birth: 25-May-1975

## 2020-02-07 ENCOUNTER — Ambulatory Visit: Payer: BLUE CROSS/BLUE SHIELD | Admitting: Physical Therapy

## 2020-02-07 ENCOUNTER — Encounter: Payer: BLUE CROSS/BLUE SHIELD | Admitting: Physical Therapy

## 2020-02-12 ENCOUNTER — Encounter: Payer: BLUE CROSS/BLUE SHIELD | Admitting: Physical Therapy

## 2020-02-14 ENCOUNTER — Ambulatory Visit: Payer: BLUE CROSS/BLUE SHIELD | Admitting: Physical Therapy

## 2020-02-19 ENCOUNTER — Encounter: Payer: Self-pay | Admitting: Physical Therapy

## 2020-02-19 ENCOUNTER — Ambulatory Visit: Payer: BLUE CROSS/BLUE SHIELD | Attending: Orthopedic Surgery | Admitting: Physical Therapy

## 2020-02-19 ENCOUNTER — Other Ambulatory Visit: Payer: Self-pay

## 2020-02-19 DIAGNOSIS — M25661 Stiffness of right knee, not elsewhere classified: Secondary | ICD-10-CM | POA: Insufficient documentation

## 2020-02-19 DIAGNOSIS — M25562 Pain in left knee: Secondary | ICD-10-CM | POA: Diagnosis not present

## 2020-02-19 DIAGNOSIS — M25561 Pain in right knee: Secondary | ICD-10-CM

## 2020-02-19 NOTE — Therapy (Addendum)
[place] [facility] [address], [place], [address] Phone: [phone]   Fax:  [phone]  Physical Therapy Treatment  Patient Details  Name: Amber Coleman MRN: [id number] Date of Birth: 1975-06-01 No data recorded  Encounter Date: 02/19/2020    Past Medical History:  Diagnosis Date  . Anxiety   . GERD (gastroesophageal reflux disease)   . Hypertension    situational; no longer needs medication  . Menorrhagia   . Obstructive sleep apnea    uses CPAP    Past Surgical History:  Procedure Laterality Date  . BILATERAL SALPINGECTOMY Bilateral 12/24/2017   Procedure: BILATERAL SALPINGECTOMY;  Surgeon: Ward, Honor Loh, MD;  Location: [facility];  Service: Gynecology;  Laterality: Bilateral;  . BREAST ENHANCEMENT SURGERY Bilateral 05/2017  . BUNIONECTOMY Left 05/13/2016  . BUNIONECTOMY Right 12/2016  . LIPOSUCTION  06/2016  . VAGINAL HYSTERECTOMY N/A 12/24/2017   Procedure: HYSTERECTOMY VAGINAL;  Surgeon: Ward, Honor Loh, MD;  Location: [facility];  Service: Gynecology;  Laterality: N/A;  . VULVECTOMY PARTIAL  12/24/2017   Procedure: VULVECTOMY PARTIAL;  Surgeon: Ward, Honor Loh, MD;  Location: [facility];  Service: Gynecology;;  . wisdom teeth   1996    There were no vitals filed for this visit.    Ther-Ex Nustep L4 32mins for gentle quad strengthening/activation  SLR with 3# AW 3 x10 min cuing to prevent adduction and for eccentric control with good carry over SL bridge 3x 10 with min cuing for full relax between reps   LLE SL squat to elevated mat table 3x 09/23/08 with min cuing for technique with good carry over LAQ 3# 3x 10 with min cuing for strong quad contraction with good carry over  Seated band hamstring curl GTB x10; BluTB 2x 10 with min cuing for eccentric control with good carry over Squat on bosu hardside 3x 10                              PT Short Term Goals - 03/21/20  1535      PT SHORT TERM GOAL #1   Title Pt will be independent with HEP in order to maintain current strength to further progress therex following rehab protocol.    Baseline 03/21/20    Time 4    Period Weeks    Status New             PT Long Term Goals - 03/21/20 1536      PT LONG TERM GOAL #1   Title Pt will decrease worst pain as reported on NPRS by at least 3 points in order to demonstrate clinically significant reduction in knee pain    Baseline 03/21/20 8/10    Time 8    Period Weeks    Status New      PT LONG TERM GOAL #2   Title Pt will increase AROM of the knee to WNL in order to complete functional ADLs, sqautting, and normalize gait and stair negotiation    Baseline 03/21/20 Flex 40 ext 4    Time 8    Period Weeks    Status New      PT LONG TERM GOAL #3   Title Pt will demonstrate a 1 RM leg press to 75# for the right knee to demonstrate increased strength in order to complete heavy household ADLs and community participation.    Baseline 03/21/20 unable to complete  resistance measure    Time 8    Period Weeks    Status New      PT LONG TERM GOAL #4   Title Patient will demonstrate a 1 RM knee ext of 45# for the right knee in order demonstrate increased strength in order to participate in heavy household ADLs and community participation.    Baseline 05/08/19 unable to complete resistance measure    Time 8    Period Weeks    Status New      PT LONG TERM GOAL #5   Title Patient will increase FOTO score to 67 to demonstrate predicted increase in functional mobility to complete ADLs    Baseline 03/21/20 47    Time 8    Period Weeks    Status New                  Patient will benefit from skilled therapeutic intervention in order to improve the following deficits and impairments:  Abnormal gait,Decreased activity tolerance,Decreased endurance,Decreased balance,Decreased mobility,Difficulty walking,Impaired flexibility,Impaired tone,Postural  dysfunction,Decreased coordination,Decreased range of motion,Decreased strength,Increased fascial restricitons,Improper body mechanics,Pain  Visit Diagnosis: Acute pain of right knee  Stiffness of right knee, not elsewhere classified     Problem List Patient Active Problem List   Diagnosis Date Noted  . Menorrhagia 06/17/2016  . Awareness of heartbeats 03/01/2015  . Hypertension 01/01/2015  . 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 Acid reflux 08/25/2014  . 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 Apnea, sleep 08/25/2014  . Hypertrophic condition of skin 08/25/2014  . 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, [place], [address] Phone: [phone]   Fax:  [phone]  Name: Amber Coleman MRN: [id number] Date of Birth: 1975-08-06

## 2020-02-21 ENCOUNTER — Encounter: Payer: Self-pay | Admitting: Physical Therapy

## 2020-02-21 ENCOUNTER — Other Ambulatory Visit: Payer: Self-pay

## 2020-02-21 ENCOUNTER — Ambulatory Visit: Payer: BLUE CROSS/BLUE SHIELD | Admitting: Physical Therapy

## 2020-02-21 DIAGNOSIS — M25562 Pain in left knee: Secondary | ICD-10-CM

## 2020-02-21 DIAGNOSIS — M25661 Stiffness of right knee, not elsewhere classified: Secondary | ICD-10-CM

## 2020-02-21 DIAGNOSIS — M25561 Pain in right knee: Secondary | ICD-10-CM

## 2020-02-21 NOTE — Therapy (Addendum)
[facility] [address], [place], [address] Phone: [phone]   Fax:  [phone]  Physical Therapy Treatment  Patient Details  Name: Amber Coleman MRN: [id number] Date of Birth: October 01, 1975 No data recorded  Encounter Date: 02/21/2020    Past Medical History:  Diagnosis Date  . Anxiety   . GERD (gastroesophageal reflux disease)   . Hypertension    situational; no longer needs medication  . Menorrhagia   . Obstructive sleep apnea    uses CPAP    Past Surgical History:  Procedure Laterality Date  . BILATERAL SALPINGECTOMY Bilateral 12/24/2017   Procedure: BILATERAL SALPINGECTOMY;  Surgeon: Ward, Elenora Fender, MD;  Location: [facility];  Service: Gynecology;  Laterality: Bilateral;  . BREAST ENHANCEMENT SURGERY Bilateral 05/2017  . BUNIONECTOMY Left 05/13/2016  . BUNIONECTOMY Right 12/2016  . LIPOSUCTION  06/2016  . VAGINAL HYSTERECTOMY N/A 12/24/2017   Procedure: HYSTERECTOMY VAGINAL;  Surgeon: Ward, Elenora Fender, MD;  Location: [facility];  Service: Gynecology;  Laterality: N/A;  . VULVECTOMY PARTIAL  12/24/2017   Procedure: VULVECTOMY PARTIAL;  Surgeon: Ward, Elenora Fender, MD;  Location: [facility];  Service: Gynecology;;  . wisdom teeth   1996    There were no vitals filed for this visit.      Ther-Ex Nustep L5 seat setting 7 for gentle quad strengthening/activation  SLR with GTB x10; BluTB x10 SL bridge x10    LLE SL squat to elevated mat table 2x 8; 10 Alt reverse lunges x12; x20 with good carry over following demo  LAQ BluTB x10; BluTB + GTB x10 Seated band hamstring curl GTB x10; BluTB 2x 10 with min cuing for eccentric control with good carry over Bridge with hamstring curl 2x 10 with good carry over of demo Hamstring stretch x30sec hold  HEP update Access Code: [email or web address] Supine Active Straight Leg Raise - 1 x daily - 2-3 x weekly - 3 sets - 10 reps Single Leg Squat with Chair Touch - 1  x daily - 2-3 x weekly - 3 sets - 8-10 reps Reverse Lunge - 1 x daily - 2-3 x weekly - 3 sets - 12-20 reps Bridge with Hamstring Curl on Swiss Ball - 1 x daily - 2-3 x weekly - 3 sets - 10 reps Seated Knee Extension with Resistance - 1 x daily - 2-3 x weekly - 3 sets - 10 reps Seated Hamstring Curl with Anchored Resistance - 1 x daily - 2-3 x weekly - 3 sets - 10 reps Seated Hamstring Stretch - 2-3 x daily - 7 x weekly - 30-60sec hold                             PT Short Term Goals - 03/21/20 1535      PT SHORT TERM GOAL #1   Title Pt will be independent with HEP in order to maintain current strength to further progress therex following rehab protocol.    Baseline 03/21/20    Time 4    Period Weeks    Status New             PT Long Term Goals - 03/21/20 1536      PT LONG TERM GOAL #1   Title Pt will decrease worst pain as reported on NPRS by at least 3 points in order to demonstrate clinically significant reduction in knee pain    Baseline 03/21/20 8/10  Time 8    Period Weeks    Status New      PT LONG TERM GOAL #2   Title Pt will increase AROM of the knee to WNL in order to complete functional ADLs, sqautting, and normalize gait and stair negotiation    Baseline 03/21/20 Flex 40 ext 4    Time 8    Period Weeks    Status New      PT LONG TERM GOAL #3   Title Pt will demonstrate a 1 RM leg press to 75# for the right knee to demonstrate increased strength in order to complete heavy household ADLs and community participation.    Baseline 03/21/20 unable to complete resistance measure    Time 8    Period Weeks    Status New      PT LONG TERM GOAL #4   Title Patient will demonstrate a 1 RM knee ext of 45# for the right knee in order demonstrate increased strength in order to participate in heavy household ADLs and community participation.    Baseline 05/08/19 unable to complete resistance measure    Time 8    Period Weeks    Status New      PT LONG  TERM GOAL #5   Title Patient will increase FOTO score to 67 to demonstrate predicted increase in functional mobility to complete ADLs    Baseline 03/21/20 47    Time 8    Period Weeks    Status New                  Patient will benefit from skilled therapeutic intervention in order to improve the following deficits and impairments:  Abnormal gait,Decreased activity tolerance,Decreased endurance,Decreased balance,Decreased mobility,Difficulty walking,Impaired flexibility,Impaired tone,Postural dysfunction,Decreased coordination,Decreased range of motion,Decreased strength,Increased fascial restricitons,Improper body mechanics,Pain  Visit Diagnosis: Acute pain of right knee  Stiffness of right knee, not elsewhere classified     Problem List Patient Active Problem List   Diagnosis Date Noted  . Menorrhagia 06/17/2016  . Awareness of heartbeats 03/01/2015  . Hypertension 01/01/2015  . 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 Acid reflux 08/25/2014  . 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 Apnea, sleep 08/25/2014  . Hypertrophic condition of skin 08/25/2014  . 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 [place], [address] Phone: [phone]   Fax:  [phone]  Name: Amber Coleman MRN: [id number] Date of Birth: September 10, 1975

## 2020-02-28 ENCOUNTER — Encounter: Payer: BLUE CROSS/BLUE SHIELD | Admitting: Physical Therapy

## 2020-03-06 ENCOUNTER — Encounter: Payer: BLUE CROSS/BLUE SHIELD | Admitting: Physical Therapy

## 2020-03-12 ENCOUNTER — Encounter: Payer: BLUE CROSS/BLUE SHIELD | Admitting: Physical Therapy

## 2020-03-21 ENCOUNTER — Encounter: Payer: Self-pay | Admitting: Physical Therapy

## 2020-03-21 ENCOUNTER — Ambulatory Visit: Payer: BLUE CROSS/BLUE SHIELD | Attending: Orthopaedic Surgery | Admitting: Physical Therapy

## 2020-03-21 ENCOUNTER — Other Ambulatory Visit: Payer: Self-pay

## 2020-03-21 DIAGNOSIS — M25661 Stiffness of right knee, not elsewhere classified: Secondary | ICD-10-CM | POA: Insufficient documentation

## 2020-03-21 DIAGNOSIS — M25569 Pain in unspecified knee: Secondary | ICD-10-CM | POA: Insufficient documentation

## 2020-03-21 DIAGNOSIS — G8918 Other acute postprocedural pain: Secondary | ICD-10-CM | POA: Diagnosis not present

## 2020-03-21 DIAGNOSIS — M25562 Pain in left knee: Secondary | ICD-10-CM | POA: Diagnosis present

## 2020-03-21 DIAGNOSIS — M25561 Pain in right knee: Secondary | ICD-10-CM

## 2020-03-21 NOTE — Therapy (Addendum)
[place] [facility] [address], [place], [address] Phone: [phone]   Fax:  [phone]  Physical Therapy Evaluation  Patient Details  Name: Amber Coleman MRN: [id number] Date of Birth: 08/13/1975 No data recorded  Encounter Date: 03/21/2020    Past Medical History:  Diagnosis Date  . Anxiety   . GERD (gastroesophageal reflux disease)   . Hypertension    situational; no longer needs medication  . Menorrhagia   . Obstructive sleep apnea    uses CPAP    Past Surgical History:  Procedure Laterality Date  . BILATERAL SALPINGECTOMY Bilateral 12/24/2017   Procedure: BILATERAL SALPINGECTOMY;  Surgeon: Ward, Honor Loh, MD;  Location: [facility];  Service: Gynecology;  Laterality: Bilateral;  . BREAST ENHANCEMENT SURGERY Bilateral 05/2017  . BUNIONECTOMY Left 05/13/2016  . BUNIONECTOMY Right 12/2016  . LIPOSUCTION  06/2016  . VAGINAL HYSTERECTOMY N/A 12/24/2017   Procedure: HYSTERECTOMY VAGINAL;  Surgeon: Ward, Honor Loh, MD;  Location: [facility];  Service: Gynecology;  Laterality: N/A;  . VULVECTOMY PARTIAL  12/24/2017   Procedure: VULVECTOMY PARTIAL;  Surgeon: Ward, Honor Loh, MD;  Location: [facility];  Service: Gynecology;;  . wisdom teeth   1996    There were no vitals filed for this visit.        Eval     Posture/Gait - Posture WNL; Gait inhibited due to leg brace Palpation/Sensation - Did not assess due to bandage wrapping  Observation: Incision site and patellar mobilization not completed d/t bandage (per pt until MD follow up). Strong quad activation noted FUNCTIONAL TEST -  1RM of R leg Leg press 75#; knee ext 45#;  Assessed stair climbing for safe ambulation up and down stairs in the home- safe step to with unilateral handrail  REPEATED MOTIONS - not tested;  no concerns of centralization/peripheralization   AROM/OP  HIP FLEX - WNL EXT - WNL IR - WNL ER - WNL  KNEE FLEX  L/R  40/WNL EXT  7/WNL   PROM/OP  HIP FLEX - WNL EXT - WNL IR - WNL ER - WNL   KNEE FLEX  L/R - 50/WNL EXT L/R  4/WNL  PAIVM/PPIVM/PAM - Not tested no concerns    MUSCLE LENGTH TEST Pat Patrick  - not tested Marcello Moores - not tested   STRENGTH Hip flex  L/R 5 Hip ext L/R 5 Hip abd  L/R 5 Hip add  L/R  5 Hip ER  L/R not tested on left/ 5 Hip IR  L/R  not tested on left/ 5   Knee flex L/R not tested on left/   5 ext   L/R Not tested on left/ 5   SPECIAL TESTS No special tests performed; no concerns of other pathology       Therex     PT reviewed the following HEP with patient with patient able to demonstrate a set of the following with min cuing for correction needed. PT educated patient on parameters of therex; how/when to inc/decrease intensity, frequency, rep/set range, and purpose of therex with verbalized understanding from patient  SLR 3x10 1x day 2-3 d/wk Quad sets 3x10 1x day 2-3 d/wk Sidelying abd 3x10 1x day 2-3 d/wk Weight shifting - as much as possible everyday as tolerated.                 Objective measurements completed on examination: See above findings.  PT Short Term Goals - 03/21/20 1535      PT SHORT TERM GOAL #1   Title Pt will be independent with HEP in order to maintain current strength to further progress therex following rehab protocol.    Baseline 03/21/20    Time 4    Period Weeks    Status New             PT Long Term Goals - 03/21/20 1536      PT LONG TERM GOAL #1   Title Pt will decrease worst pain as reported on NPRS by at least 3 points in order to demonstrate clinically significant reduction in knee pain    Baseline 03/21/20 8/10    Time 8    Period Weeks    Status New      PT LONG TERM GOAL #2   Title Pt will increase AROM of the knee to WNL in order to complete functional ADLs, sqautting, and normalize gait and stair negotiation    Baseline 03/21/20 Flex 40 ext 4    Time 8    Period Weeks     Status New      PT LONG TERM GOAL #3   Title Pt will demonstrate a 1 RM leg press to 75# for the right knee to demonstrate increased strength in order to complete heavy household ADLs and community participation.    Baseline 03/21/20 unable to complete resistance measure    Time 8    Period Weeks    Status New      PT LONG TERM GOAL #4   Title Patient will demonstrate a 1 RM knee ext of 45# for the right knee in order demonstrate increased strength in order to participate in heavy household ADLs and community participation.    Baseline 05/08/19 unable to complete resistance measure    Time 8    Period Weeks    Status New      PT LONG TERM GOAL #5   Title Patient will increase FOTO score to 67 to demonstrate predicted increase in functional mobility to complete ADLs    Baseline 03/21/20 47    Time 8    Period Weeks    Status New                   Patient will benefit from skilled therapeutic intervention in order to improve the following deficits and impairments:  Abnormal gait,Decreased activity tolerance,Decreased endurance,Decreased balance,Decreased mobility,Difficulty walking,Impaired flexibility,Impaired tone,Postural dysfunction,Decreased coordination,Decreased range of motion,Decreased strength,Increased fascial restricitons,Improper body mechanics,Pain  Visit Diagnosis: Acute pain of right knee  Acute pain of left knee - Plan: PT plan of care cert/re-cert     Problem List Patient Active Problem List   Diagnosis Date Noted  . Menorrhagia 06/17/2016  . Awareness of heartbeats 03/01/2015  . Hypertension 01/01/2015  . 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 Acid reflux 08/25/2014  . 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 Apnea, sleep 08/25/2014  . Hypertrophic condition of skin 08/25/2014  . 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 [place], [address] Phone: [phone]   Fax:  [phone]  Name: Amber Coleman MRN: [id number] Date of Birth: 10/07/1975

## 2020-03-25 ENCOUNTER — Ambulatory Visit: Payer: BLUE CROSS/BLUE SHIELD | Admitting: Physical Therapy

## 2020-03-25 ENCOUNTER — Other Ambulatory Visit: Payer: Self-pay

## 2020-03-25 ENCOUNTER — Encounter: Payer: Self-pay | Admitting: Physical Therapy

## 2020-03-25 DIAGNOSIS — M25561 Pain in right knee: Secondary | ICD-10-CM

## 2020-03-25 DIAGNOSIS — M25562 Pain in left knee: Secondary | ICD-10-CM

## 2020-03-25 DIAGNOSIS — G8918 Other acute postprocedural pain: Secondary | ICD-10-CM

## 2020-03-25 NOTE — Therapy (Addendum)
[place] [facility] [address], [place], [address] Phone: [phone]   Fax:  [phone]  Physical Therapy Treatment  Patient Details  Name: Amber Coleman MRN: [id number] Date of Birth: 1975/02/25 No data recorded  Encounter Date: 03/25/2020    Past Medical History:  Diagnosis Date  . Anxiety   . GERD (gastroesophageal reflux disease)   . Hypertension    situational; no longer needs medication  . Menorrhagia   . Obstructive sleep apnea    uses CPAP    Past Surgical History:  Procedure Laterality Date  . BILATERAL SALPINGECTOMY Bilateral 12/24/2017   Procedure: BILATERAL SALPINGECTOMY;  Surgeon: Ward, Honor Loh, MD;  Location: [facility];  Service: Gynecology;  Laterality: Bilateral;  . BREAST ENHANCEMENT SURGERY Bilateral 05/2017  . BUNIONECTOMY Left 05/13/2016  . BUNIONECTOMY Right 12/2016  . LIPOSUCTION  06/2016  . VAGINAL HYSTERECTOMY N/A 12/24/2017   Procedure: HYSTERECTOMY VAGINAL;  Surgeon: Ward, Honor Loh, MD;  Location: [facility];  Service: Gynecology;  Laterality: N/A;  . VULVECTOMY PARTIAL  12/24/2017   Procedure: VULVECTOMY PARTIAL;  Surgeon: Ward, Honor Loh, MD;  Location: [facility];  Service: Gynecology;;  . wisdom teeth   1996    There were no vitals filed for this visit.    THEREX  Recumbent bike 5 min ; seat all the way back; active assisted ROM going back and forth for gentle ROM  PROM flexion heel slides 3x10  AROM flexion heel slides 3x10  Quad sets 3 x10; 2-3 sec hold  Supine passive ext stretch; keeping leg straight in neutral allowing gravity to help bring knee into more ext in between all sets of therex  SLR w/ brace 2 x10  Sidelying abd w/ brace 2x 10                            PT Short Term Goals - 03/21/20 1535      PT SHORT TERM GOAL #1   Title Pt will be independent with HEP in order to maintain current strength to further progress  therex following rehab protocol.    Baseline 03/21/20    Time 4    Period Weeks    Status New             PT Long Term Goals - 03/21/20 1536      PT LONG TERM GOAL #1   Title Pt will decrease worst pain as reported on NPRS by at least 3 points in order to demonstrate clinically significant reduction in knee pain    Baseline 03/21/20 8/10    Time 8    Period Weeks    Status New      PT LONG TERM GOAL #2   Title Pt will increase AROM of the knee to WNL in order to complete functional ADLs, sqautting, and normalize gait and stair negotiation    Baseline 03/21/20 Flex 40 ext 4    Time 8    Period Weeks    Status New      PT LONG TERM GOAL #3   Title Pt will demonstrate a 1 RM leg press to 75# for the right knee to demonstrate increased strength in order to complete heavy household ADLs and community participation.    Baseline 03/21/20 unable to complete resistance measure    Time 8    Period Weeks    Status New      PT  LONG TERM GOAL #4   Title Patient will demonstrate a 1 RM knee ext of 45# for the right knee in order demonstrate increased strength in order to participate in heavy household ADLs and community participation.    Baseline 05/08/19 unable to complete resistance measure    Time 8    Period Weeks    Status New      PT LONG TERM GOAL #5   Title Patient will increase FOTO score to 67 to demonstrate predicted increase in functional mobility to complete ADLs    Baseline 03/21/20 47    Time 8    Period Weeks    Status New                  Patient will benefit from skilled therapeutic intervention in order to improve the following deficits and impairments:  Abnormal gait,Decreased activity tolerance,Decreased endurance,Decreased balance,Decreased mobility,Difficulty walking,Impaired flexibility,Impaired tone,Postural dysfunction,Decreased coordination,Decreased range of motion,Decreased strength,Increased fascial restricitons,Improper body mechanics,Pain  Visit  Diagnosis: Acute pain of right knee     Problem List Patient Active Problem List   Diagnosis Date Noted  . Menorrhagia 06/17/2016  . Awareness of heartbeats 03/01/2015  . Hypertension 01/01/2015  . 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 Acid reflux 08/25/2014  . 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 Apnea, sleep 08/25/2014  . Hypertrophic condition of skin 08/25/2014  . 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, [place], [address] Phone: [phone]   Fax:  [phone]  Name: Amber Coleman MRN: [id number] Date of Birth: [birth year]

## 2020-03-27 ENCOUNTER — Ambulatory Visit: Payer: BLUE CROSS/BLUE SHIELD | Admitting: Physical Therapy

## 2020-03-27 ENCOUNTER — Encounter: Payer: Self-pay | Admitting: Physical Therapy

## 2020-03-27 ENCOUNTER — Other Ambulatory Visit: Payer: Self-pay

## 2020-03-27 DIAGNOSIS — M25562 Pain in left knee: Secondary | ICD-10-CM

## 2020-03-27 DIAGNOSIS — M25561 Pain in right knee: Secondary | ICD-10-CM

## 2020-03-27 DIAGNOSIS — G8918 Other acute postprocedural pain: Secondary | ICD-10-CM

## 2020-03-27 NOTE — Therapy (Addendum)
[facility] [address], [place], [address] Phone: [phone]   Fax:  [phone]  Physical Therapy Treatment  Patient Details  Name: Amber Coleman MRN: [id number] Date of Birth: [birth year] No data recorded  Encounter Date: 03/27/2020    Past Medical History:  Diagnosis Date  . Anxiety   . GERD (gastroesophageal reflux disease)   . Hypertension    situational; no longer needs medication  . Menorrhagia   . Obstructive sleep apnea    uses CPAP    Past Surgical History:  Procedure Laterality Date  . BILATERAL SALPINGECTOMY Bilateral 12/24/2017   Procedure: BILATERAL SALPINGECTOMY;  Surgeon: Ward, Honor Loh, MD;  Location: [facility];  Service: Gynecology;  Laterality: Bilateral;  . BREAST ENHANCEMENT SURGERY Bilateral 05/2017  . BUNIONECTOMY Left 05/13/2016  . BUNIONECTOMY Right 12/2016  . LIPOSUCTION  06/2016  . VAGINAL HYSTERECTOMY N/A 12/24/2017   Procedure: HYSTERECTOMY VAGINAL;  Surgeon: Ward, Honor Loh, MD;  Location: [facility];  Service: Gynecology;  Laterality: N/A;  . VULVECTOMY PARTIAL  12/24/2017   Procedure: VULVECTOMY PARTIAL;  Surgeon: Ward, Honor Loh, MD;  Location: [facility];  Service: Gynecology;;  . wisdom teeth   1996    There were no vitals filed for this visit.    THEREX   Recumbent bike 5 min ; seat all the way back; active assisted ROM going back and forth for gentle ROM   PROM flexion heel slides 3x10   AROM flexion heel slides 3x10   Quad sets 3 x10; 2-3 sec hold    SLR w/ brace 2 x10   Sidelying abd w/ brace 2x 10   Manual Therapy  Patellar mobilizations of superior/inferior/medial glides                            PT Short Term Goals - 03/21/20 1535      PT SHORT TERM GOAL #1   Title Pt will be independent with HEP in order to maintain current strength to further progress therex following rehab protocol.    Baseline 03/21/20     Time 4    Period Weeks    Status New             PT Long Term Goals - 03/21/20 1536      PT LONG TERM GOAL #1   Title Pt will decrease worst pain as reported on NPRS by at least 3 points in order to demonstrate clinically significant reduction in knee pain    Baseline 03/21/20 8/10    Time 8    Period Weeks    Status New      PT LONG TERM GOAL #2   Title Pt will increase AROM of the knee to WNL in order to complete functional ADLs, sqautting, and normalize gait and stair negotiation    Baseline 03/21/20 Flex 40 ext 4    Time 8    Period Weeks    Status New      PT LONG TERM GOAL #3   Title Pt will demonstrate a 1 RM leg press to 75# for the right knee to demonstrate increased strength in order to complete heavy household ADLs and community participation.    Baseline 03/21/20 unable to complete resistance measure    Time 8    Period Weeks    Status New      PT LONG TERM GOAL #4   Title Patient  will demonstrate a 1 RM knee ext of 45# for the right knee in order demonstrate increased strength in order to participate in heavy household ADLs and community participation.    Baseline 05/08/19 unable to complete resistance measure    Time 8    Period Weeks    Status New      PT LONG TERM GOAL #5   Title Patient will increase FOTO score to 67 to demonstrate predicted increase in functional mobility to complete ADLs    Baseline 03/21/20 47    Time 8    Period Weeks    Status New                  Patient will benefit from skilled therapeutic intervention in order to improve the following deficits and impairments:  Abnormal gait,Decreased activity tolerance,Decreased endurance,Decreased balance,Decreased mobility,Difficulty walking,Impaired flexibility,Impaired tone,Postural dysfunction,Decreased coordination,Decreased range of motion,Decreased strength,Increased fascial restricitons,Improper body mechanics,Pain  Visit Diagnosis: Acute pain of right knee     Problem  List Patient Active Problem List   Diagnosis Date Noted  . Menorrhagia 06/17/2016  . Awareness of heartbeats 03/01/2015  . Hypertension 01/01/2015  . Social phobia involving fear of public speaking 97/74/1423  . Acid reflux 08/25/2014  . 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 Apnea, sleep 08/25/2014  . Hypertrophic condition of skin 08/25/2014  . 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 [place], [address] Phone: [phone]   Fax:  [phone]  Name: Amber Coleman MRN: [id number] Date of Birth: Mar 06, 1975

## 2020-03-29 ENCOUNTER — Encounter: Payer: BLUE CROSS/BLUE SHIELD | Admitting: Physical Therapy

## 2020-04-01 ENCOUNTER — Encounter: Payer: Self-pay | Admitting: Physical Therapy

## 2020-04-01 ENCOUNTER — Ambulatory Visit: Payer: BLUE CROSS/BLUE SHIELD | Admitting: Physical Therapy

## 2020-04-01 ENCOUNTER — Other Ambulatory Visit: Payer: Self-pay

## 2020-04-01 DIAGNOSIS — M25569 Pain in unspecified knee: Secondary | ICD-10-CM

## 2020-04-01 DIAGNOSIS — M25561 Pain in right knee: Secondary | ICD-10-CM | POA: Diagnosis not present

## 2020-04-01 DIAGNOSIS — M25562 Pain in left knee: Secondary | ICD-10-CM

## 2020-04-01 DIAGNOSIS — G8918 Other acute postprocedural pain: Secondary | ICD-10-CM

## 2020-04-01 NOTE — Therapy (Cosign Needed Addendum)
[facility] [address] [place], [address] Phone: [phone]   Fax:  [phone]  Physical Therapy Treatment  Patient Details  Name: Amber Coleman MRN: [id number] Date of Birth: 1975-08-25 No data recorded  Encounter Date: 04/01/2020    Past Medical History:  Diagnosis Date  . Anxiety   . GERD (gastroesophageal reflux disease)   . Hypertension    situational; no longer needs medication  . Menorrhagia   . Obstructive sleep apnea    uses CPAP    Past Surgical History:  Procedure Laterality Date  . BILATERAL SALPINGECTOMY Bilateral 12/24/2017   Procedure: BILATERAL SALPINGECTOMY;  Surgeon: Ward, Honor Loh, MD;  Location: [facility];  Service: Gynecology;  Laterality: Bilateral;  . BREAST ENHANCEMENT SURGERY Bilateral 05/2017  . BUNIONECTOMY Left 05/13/2016  . BUNIONECTOMY Right 12/2016  . LIPOSUCTION  06/2016  . VAGINAL HYSTERECTOMY N/A 12/24/2017   Procedure: HYSTERECTOMY VAGINAL;  Surgeon: Ward, Honor Loh, MD;  Location: [facility];  Service: Gynecology;  Laterality: N/A;  . VULVECTOMY PARTIAL  12/24/2017   Procedure: VULVECTOMY PARTIAL;  Surgeon: Ward, Honor Loh, MD;  Location: [facility];  Service: Gynecology;;  . wisdom teeth   1996    There were no vitals filed for this visit.    THEREX   Recumbent bike 5 min ; seat 13; active assisted ROM going back and forth for gentle ROM; with brace   PROM flexion heel slides 3x10; with slide overpressure   AROM flexion heel slides 3x10; with slight overpressure   Mini squats 0-45 degrees 4x10; hand held assist to treadmill handle; WBAT with brace  TKE w/ RTB 3x 12; WBAT with brace        Manual Therapy   Patellar mobilizations of superior/inferior/medial glides                            PT Short Term Goals - 03/21/20 1535      PT SHORT TERM GOAL #1   Title Pt will be independent with HEP in order to maintain  current strength to further progress therex following rehab protocol.    Baseline 03/21/20    Time 4    Period Weeks    Status New             PT Long Term Goals - 03/21/20 1536      PT LONG TERM GOAL #1   Title Pt will decrease worst pain as reported on NPRS by at least 3 points in order to demonstrate clinically significant reduction in knee pain    Baseline 03/21/20 8/10    Time 8    Period Weeks    Status New      PT LONG TERM GOAL #2   Title Pt will increase AROM of the knee to WNL in order to complete functional ADLs, sqautting, and normalize gait and stair negotiation    Baseline 03/21/20 Flex 40 ext 4    Time 8    Period Weeks    Status New      PT LONG TERM GOAL #3   Title Pt will demonstrate a 1 RM leg press to 75# for the right knee to demonstrate increased strength in order to complete heavy household ADLs and community participation.    Baseline 03/21/20 unable to complete resistance measure    Time 8    Period Weeks    Status New  PT LONG TERM GOAL #4   Title Patient will demonstrate a 1 RM knee ext of 45# for the right knee in order demonstrate increased strength in order to participate in heavy household ADLs and community participation.    Baseline 05/08/19 unable to complete resistance measure    Time 8    Period Weeks    Status New      PT LONG TERM GOAL #5   Title Patient will increase FOTO score to 67 to demonstrate predicted increase in functional mobility to complete ADLs    Baseline 03/21/20 47    Time 8    Period Weeks    Status New                  Patient will benefit from skilled therapeutic intervention in order to improve the following deficits and impairments:  Abnormal gait,Decreased activity tolerance,Decreased endurance,Decreased balance,Decreased mobility,Difficulty walking,Impaired flexibility,Impaired tone,Postural dysfunction,Decreased coordination,Decreased range of motion,Decreased strength,Increased fascial  restricitons,Improper body mechanics,Pain  Visit Diagnosis: Acute pain of left knee  Acute pain of right knee     Problem List Patient Active Problem List   Diagnosis Date Noted  . Menorrhagia 06/17/2016  . Awareness of heartbeats 03/01/2015  . Hypertension 01/01/2015  . Social phobia involving fear of public speaking 73/41/9379  . Acid reflux 08/25/2014  . 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 Apnea, sleep 08/25/2014  . Hypertrophic condition of skin 08/25/2014  . 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 [place], [address] Phone: [phone]   Fax:  [phone]  Name: Amber Coleman MRN: [id number] Date of Birth: 1975/08/04

## 2020-04-05 ENCOUNTER — Encounter: Payer: Self-pay | Admitting: Physical Therapy

## 2020-04-05 ENCOUNTER — Ambulatory Visit: Payer: BLUE CROSS/BLUE SHIELD | Admitting: Physical Therapy

## 2020-04-05 ENCOUNTER — Other Ambulatory Visit: Payer: Self-pay

## 2020-04-05 DIAGNOSIS — M25562 Pain in left knee: Secondary | ICD-10-CM

## 2020-04-05 DIAGNOSIS — G8918 Other acute postprocedural pain: Secondary | ICD-10-CM

## 2020-04-05 DIAGNOSIS — M25561 Pain in right knee: Secondary | ICD-10-CM

## 2020-04-05 DIAGNOSIS — M25661 Stiffness of right knee, not elsewhere classified: Secondary | ICD-10-CM

## 2020-04-05 NOTE — Therapy (Addendum)
[facility] [address] [place], [address] Phone: [phone]   Fax:  [phone]  Physical Therapy Treatment  Patient Details  Name: Amber Coleman MRN: [id number] Date of Birth: 1975/12/23 No data recorded  Encounter Date: 04/05/2020    Past Medical History:  Diagnosis Date  . Anxiety   . GERD (gastroesophageal reflux disease)   . Hypertension    situational; no longer needs medication  . Menorrhagia   . Obstructive sleep apnea    uses CPAP    Past Surgical History:  Procedure Laterality Date  . BILATERAL SALPINGECTOMY Bilateral 12/24/2017   Procedure: BILATERAL SALPINGECTOMY;  Surgeon: Ward, Honor Loh, MD;  Location: [facility];  Service: Gynecology;  Laterality: Bilateral;  . BREAST ENHANCEMENT SURGERY Bilateral 05/2017  . BUNIONECTOMY Left 05/13/2016  . BUNIONECTOMY Right 12/2016  . LIPOSUCTION  06/2016  . VAGINAL HYSTERECTOMY N/A 12/24/2017   Procedure: HYSTERECTOMY VAGINAL;  Surgeon: Ward, Honor Loh, MD;  Location: [facility];  Service: Gynecology;  Laterality: N/A;  . VULVECTOMY PARTIAL  12/24/2017   Procedure: VULVECTOMY PARTIAL;  Surgeon: Ward, Honor Loh, MD;  Location: [facility];  Service: Gynecology;;  . wisdom teeth   1996    There were no vitals filed for this visit.    THEREX   Recumbent bike 5 min ; seat 12; active assisted ROM going back and forth for gentle ROM; with brace   PROM flexion heel slides 3x10; with slight overpressure; pillow case placed under heel for less friction   AROM flexion heel slides 3x10; with slight overpressure; pillow case under heel for reduced friction   Mini squats 0-45 degrees 4x10; hand held assist to treadmill handle; WBAT with brace   TKE w/ RTB 3x 12; WBAT with brace        Manual Therapy   Patellar mobilizations of superior/inferior/medial glides                             PT Short Term Goals - 03/21/20 1535       PT SHORT TERM GOAL #1   Title Pt will be independent with HEP in order to maintain current strength to further progress therex following rehab protocol.    Baseline 03/21/20    Time 4    Period Weeks    Status New             PT Long Term Goals - 03/21/20 1536      PT LONG TERM GOAL #1   Title Pt will decrease worst pain as reported on NPRS by at least 3 points in order to demonstrate clinically significant reduction in knee pain    Baseline 03/21/20 8/10    Time 8    Period Weeks    Status New      PT LONG TERM GOAL #2   Title Pt will increase AROM of the knee to WNL in order to complete functional ADLs, sqautting, and normalize gait and stair negotiation    Baseline 03/21/20 Flex 40 ext 4    Time 8    Period Weeks    Status New      PT LONG TERM GOAL #3   Title Pt will demonstrate a 1 RM leg press to 75# for the right knee to demonstrate increased strength in order to complete heavy household ADLs and community participation.    Baseline 03/21/20 unable to complete resistance measure  Time 8    Period Weeks    Status New      PT LONG TERM GOAL #4   Title Patient will demonstrate a 1 RM knee ext of 45# for the right knee in order demonstrate increased strength in order to participate in heavy household ADLs and community participation.    Baseline 05/08/19 unable to complete resistance measure    Time 8    Period Weeks    Status New      PT LONG TERM GOAL #5   Title Patient will increase FOTO score to 67 to demonstrate predicted increase in functional mobility to complete ADLs    Baseline 03/21/20 47    Time 8    Period Weeks    Status New                  Patient will benefit from skilled therapeutic intervention in order to improve the following deficits and impairments:  Abnormal gait,Decreased activity tolerance,Decreased endurance,Decreased balance,Decreased mobility,Difficulty walking,Impaired flexibility,Impaired tone,Postural dysfunction,Decreased  coordination,Decreased range of motion,Decreased strength,Increased fascial restricitons,Improper body mechanics,Pain  Visit Diagnosis: Acute pain of left knee  Acute pain of right knee     Problem List Patient Active Problem List   Diagnosis Date Noted  . Menorrhagia 06/17/2016  . Awareness of heartbeats 03/01/2015  . Hypertension 01/01/2015  . Social phobia involving fear of public speaking 88/89/1694  . Acid reflux 08/25/2014  . 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 Apnea, sleep 08/25/2014  . Hypertrophic condition of skin 08/25/2014  . 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, [place], [address] Phone: [phone]   Fax:  [phone]  Name: Terrian Ridlon MRN: [id number] Date of Birth: 1976-02-15

## 2020-04-09 ENCOUNTER — Encounter: Payer: BLUE CROSS/BLUE SHIELD | Admitting: Physical Therapy

## 2020-04-10 ENCOUNTER — Other Ambulatory Visit: Payer: Self-pay

## 2020-04-10 ENCOUNTER — Ambulatory Visit: Payer: BLUE CROSS/BLUE SHIELD | Admitting: Physical Therapy

## 2020-04-10 ENCOUNTER — Encounter: Payer: Self-pay | Admitting: Physical Therapy

## 2020-04-10 DIAGNOSIS — G8918 Other acute postprocedural pain: Secondary | ICD-10-CM

## 2020-04-10 DIAGNOSIS — M25561 Pain in right knee: Secondary | ICD-10-CM

## 2020-04-10 DIAGNOSIS — M25562 Pain in left knee: Secondary | ICD-10-CM

## 2020-04-10 DIAGNOSIS — M25661 Stiffness of right knee, not elsewhere classified: Secondary | ICD-10-CM

## 2020-04-10 NOTE — Therapy (Cosign Needed Addendum)
[facility] [address], [place], [address] Phone: [phone]   Fax:  [phone]  Physical Therapy Treatment  Patient Details  Name: Amber Coleman MRN: [id number] Date of Birth: 1975/12/22 No data recorded  Encounter Date: 04/10/2020    Past Medical History:  Diagnosis Date  . Anxiety   . GERD (gastroesophageal reflux disease)   . Hypertension    situational; no longer needs medication  . Menorrhagia   . Obstructive sleep apnea    uses CPAP    Past Surgical History:  Procedure Laterality Date  . BILATERAL SALPINGECTOMY Bilateral 12/24/2017   Procedure: BILATERAL SALPINGECTOMY;  Surgeon: Ward, Honor Loh, MD;  Location: [facility];  Service: Gynecology;  Laterality: Bilateral;  . BREAST ENHANCEMENT SURGERY Bilateral 05/2017  . BUNIONECTOMY Left 05/13/2016  . BUNIONECTOMY Right 12/2016  . LIPOSUCTION  06/2016  . VAGINAL HYSTERECTOMY N/A 12/24/2017   Procedure: HYSTERECTOMY VAGINAL;  Surgeon: Ward, Honor Loh, MD;  Location: [facility];  Service: Gynecology;  Laterality: N/A;  . VULVECTOMY PARTIAL  12/24/2017   Procedure: VULVECTOMY PARTIAL;  Surgeon: Ward, Honor Loh, MD;  Location: [facility];  Service: Gynecology;;  . wisdom teeth   1996    There were no vitals filed for this visit.      THEREX   Recumbent bike 5 min ; seat 12; active assisted ROM going back and forth for gentle ROM; with brace   PROM flexion heel slides 3x10; with slight overpressure; pillow case placed under heel for less friction   AROM flexion heel slides 3x10; with slight overpressure; pillow case under heel for reduced friction   Leg press w/ bilateral push 55# 3x10; 0-45 degrees w/ min cuing to not lock out knee   SL leg press w/ LLE 15# 2x 8 0-45 degrees w/ min cuing to not lock out knee  SL lunge w/L LE 3x10; 0-45 degrees  TRX SL squat 3x10; w/ CLE out in front for support; 0-45  degrees                           PT Short Term Goals - 03/21/20 1535      PT SHORT TERM GOAL #1   Title Pt will be independent with HEP in order to maintain current strength to further progress therex following rehab protocol.    Baseline 03/21/20    Time 4    Period Weeks    Status New             PT Long Term Goals - 03/21/20 1536      PT LONG TERM GOAL #1   Title Pt will decrease worst pain as reported on NPRS by at least 3 points in order to demonstrate clinically significant reduction in knee pain    Baseline 03/21/20 8/10    Time 8    Period Weeks    Status New      PT LONG TERM GOAL #2   Title Pt will increase AROM of the knee to WNL in order to complete functional ADLs, sqautting, and normalize gait and stair negotiation    Baseline 03/21/20 Flex 40 ext 4    Time 8    Period Weeks    Status New      PT LONG TERM GOAL #3   Title Pt will demonstrate a 1 RM leg press to 75# for the right knee to demonstrate increased strength in order  to complete heavy household ADLs and community participation.    Baseline 03/21/20 unable to complete resistance measure    Time 8    Period Weeks    Status New      PT LONG TERM GOAL #4   Title Patient will demonstrate a 1 RM knee ext of 45# for the right knee in order demonstrate increased strength in order to participate in heavy household ADLs and community participation.    Baseline 05/08/19 unable to complete resistance measure    Time 8    Period Weeks    Status New      PT LONG TERM GOAL #5   Title Patient will increase FOTO score to 67 to demonstrate predicted increase in functional mobility to complete ADLs    Baseline 03/21/20 47    Time 8    Period Weeks    Status New                  Patient will benefit from skilled therapeutic intervention in order to improve the following deficits and impairments:  Abnormal gait,Decreased activity tolerance,Decreased endurance,Decreased balance,Decreased  mobility,Difficulty walking,Impaired flexibility,Impaired tone,Postural dysfunction,Decreased coordination,Decreased range of motion,Decreased strength,Increased fascial restricitons,Improper body mechanics,Pain  Visit Diagnosis: Acute pain of left knee  Acute pain of right knee     Problem List Patient Active Problem List   Diagnosis Date Noted  . Menorrhagia 06/17/2016  . Awareness of heartbeats 03/01/2015  . Hypertension 01/01/2015  . 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 Acid reflux 08/25/2014  . 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 Apnea, sleep 08/25/2014  . Hypertrophic condition of skin 08/25/2014  . 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, [place], [address] Phone: [phone]   Fax:  [phone]  Name: Amber Coleman MRN: [id number] Date of Birth: 01-09-1976

## 2020-04-11 ENCOUNTER — Ambulatory Visit: Payer: BLUE CROSS/BLUE SHIELD | Admitting: Physical Therapy

## 2020-04-11 ENCOUNTER — Encounter: Payer: Self-pay | Admitting: Physical Therapy

## 2020-04-11 DIAGNOSIS — M25661 Stiffness of right knee, not elsewhere classified: Secondary | ICD-10-CM

## 2020-04-11 DIAGNOSIS — G8918 Other acute postprocedural pain: Secondary | ICD-10-CM

## 2020-04-11 DIAGNOSIS — M25561 Pain in right knee: Secondary | ICD-10-CM

## 2020-04-11 DIAGNOSIS — M25562 Pain in left knee: Secondary | ICD-10-CM

## 2020-04-11 NOTE — Therapy (Cosign Needed Addendum)
[facility] [address], [place], [address] Phone: [phone]   Fax:  [phone]  Physical Therapy Treatment  Patient Details  Name: Amber Coleman MRN: [id number] Date of Birth: [birth year] No data recorded  Encounter Date: 04/11/2020    Past Medical History:  Diagnosis Date  . Anxiety   . GERD (gastroesophageal reflux disease)   . Hypertension    situational; no longer needs medication  . Menorrhagia   . Obstructive sleep apnea    uses CPAP    Past Surgical History:  Procedure Laterality Date  . BILATERAL SALPINGECTOMY Bilateral 12/24/2017   Procedure: BILATERAL SALPINGECTOMY;  Surgeon: Ward, Honor Loh, MD;  Location: [facility];  Service: Gynecology;  Laterality: Bilateral;  . BREAST ENHANCEMENT SURGERY Bilateral 05/2017  . BUNIONECTOMY Left 05/13/2016  . BUNIONECTOMY Right 12/2016  . LIPOSUCTION  06/2016  . VAGINAL HYSTERECTOMY N/A 12/24/2017   Procedure: HYSTERECTOMY VAGINAL;  Surgeon: Ward, Honor Loh, MD;  Location: [facility];  Service: Gynecology;  Laterality: N/A;  . VULVECTOMY PARTIAL  12/24/2017   Procedure: VULVECTOMY PARTIAL;  Surgeon: Ward, Honor Loh, MD;  Location: [facility];  Service: Gynecology;;  . wisdom teeth   1996    There were no vitals filed for this visit.      Therex    Recumbent bike 5 min ; seat 12; active assisted ROM going back and forth for gentle ROM; with brace   PROM flexion heel slides 3x10; with slight overpressure; pillow case placed under heel for less friction   AROM flexion heel slides 3x10; with slight overpressure; pillow case under heel for reduced friction   Leg press w/ bilateral push 55# 3x10; 0-45 degrees w/ min cuing to not lock out knee    SL leg press w/ LLE 15# 2x 10 0-45 degrees w/ min cuing to not lock out knee   SL lunge w/L LE 3x10; 0-45 degrees   TRX SL squat 3x10; w/ CLE out in front for support; 0-45 degrees  Manual Therapy    Patellar mobilizations of superior/inferior/medial glides                           PT Short Term Goals - 03/21/20 1535      PT SHORT TERM GOAL #1   Title Pt will be independent with HEP in order to maintain current strength to further progress therex following rehab protocol.    Baseline 03/21/20    Time 4    Period Weeks    Status New             PT Long Term Goals - 03/21/20 1536      PT LONG TERM GOAL #1   Title Pt will decrease worst pain as reported on NPRS by at least 3 points in order to demonstrate clinically significant reduction in knee pain    Baseline 03/21/20 8/10    Time 8    Period Weeks    Status New      PT LONG TERM GOAL #2   Title Pt will increase AROM of the knee to WNL in order to complete functional ADLs, sqautting, and normalize gait and stair negotiation    Baseline 03/21/20 Flex 40 ext 4    Time 8    Period Weeks    Status New      PT LONG TERM GOAL #3   Title Pt will demonstrate a 1 RM  leg press to 75# for the right knee to demonstrate increased strength in order to complete heavy household ADLs and community participation.    Baseline 03/21/20 unable to complete resistance measure    Time 8    Period Weeks    Status New      PT LONG TERM GOAL #4   Title Patient will demonstrate a 1 RM knee ext of 45# for the right knee in order demonstrate increased strength in order to participate in heavy household ADLs and community participation.    Baseline 05/08/19 unable to complete resistance measure    Time 8    Period Weeks    Status New      PT LONG TERM GOAL #5   Title Patient will increase FOTO score to 67 to demonstrate predicted increase in functional mobility to complete ADLs    Baseline 03/21/20 47    Time 8    Period Weeks    Status New                  Patient will benefit from skilled therapeutic intervention in order to improve the following deficits and impairments:  Abnormal gait,Decreased activity  tolerance,Decreased endurance,Decreased balance,Decreased mobility,Difficulty walking,Impaired flexibility,Impaired tone,Postural dysfunction,Decreased coordination,Decreased range of motion,Decreased strength,Increased fascial restricitons,Improper body mechanics,Pain  Visit Diagnosis: Acute pain of right knee     Problem List Patient Active Problem List   Diagnosis Date Noted  . Menorrhagia 06/17/2016  . Awareness of heartbeats 03/01/2015  . Hypertension 01/01/2015  . Social phobia involving fear of public speaking 70/02/7492  . Acid reflux 08/25/2014  . 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 Apnea, sleep 08/25/2014  . Hypertrophic condition of skin 08/25/2014  . 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 [place], [address] Phone: [phone]   Fax:  [phone]  Name: Maddelynn Moosman MRN: [id number] Date of Birth: 1975/05/13

## 2020-04-11 NOTE — Addendum Note (Signed)
Addended by: Kelton Pillar on: 04/11/2020 12:58 PM   Modules accepted: Orders

## 2020-04-15 ENCOUNTER — Encounter: Payer: Self-pay | Admitting: Physical Therapy

## 2020-04-15 ENCOUNTER — Other Ambulatory Visit: Payer: Self-pay

## 2020-04-15 ENCOUNTER — Ambulatory Visit: Payer: BLUE CROSS/BLUE SHIELD | Admitting: Physical Therapy

## 2020-04-15 DIAGNOSIS — M25561 Pain in right knee: Secondary | ICD-10-CM | POA: Diagnosis not present

## 2020-04-15 DIAGNOSIS — M25562 Pain in left knee: Secondary | ICD-10-CM

## 2020-04-15 DIAGNOSIS — G8918 Other acute postprocedural pain: Secondary | ICD-10-CM

## 2020-04-15 DIAGNOSIS — M25661 Stiffness of right knee, not elsewhere classified: Secondary | ICD-10-CM

## 2020-04-15 NOTE — Therapy (Addendum)
[facility] [address] [place], [address] Phone: [phone]   Fax:  [phone]  Physical Therapy Treatment  Patient Details  Name: Amber Coleman MRN: [id number] Date of Birth: [birth year] No data recorded  Encounter Date: 04/15/2020   PT End of Session - 04/15/20 1122    Visit Number 8    Number of Visits 17    Date for PT Re-Evaluation 05/16/20    Authorization Type [employer] 30 02/17/20 - 02/15/21    Authorization - Visit Number 10    Authorization - Number of Visits 30    PT Start Time 2841    PT Stop Time 1157    PT Time Calculation (min) 40 min    Activity Tolerance Patient tolerated treatment well    Behavior During Therapy [facility] for tasks assessed/performed           Past Medical History:  Diagnosis Date  . Anxiety   . GERD (gastroesophageal reflux disease)   . Hypertension    situational; no longer needs medication  . Menorrhagia   . Obstructive sleep apnea    uses CPAP    Past Surgical History:  Procedure Laterality Date  . BILATERAL SALPINGECTOMY Bilateral 12/24/2017   Procedure: BILATERAL SALPINGECTOMY;  Surgeon: Ward, Honor Loh, MD;  Location: [facility];  Service: Gynecology;  Laterality: Bilateral;  . BREAST ENHANCEMENT SURGERY Bilateral 05/2017  . BUNIONECTOMY Left 05/13/2016  . BUNIONECTOMY Right 12/2016  . LIPOSUCTION  06/2016  . VAGINAL HYSTERECTOMY N/A 12/24/2017   Procedure: HYSTERECTOMY VAGINAL;  Surgeon: Ward, Honor Loh, MD;  Location: [facility];  Service: Gynecology;  Laterality: N/A;  . VULVECTOMY PARTIAL  12/24/2017   Procedure: VULVECTOMY PARTIAL;  Surgeon: Ward, Honor Loh, MD;  Location: [facility];  Service: Gynecology;;  . wisdom teeth   1996    There were no vitals filed for this visit.   Subjective Assessment - 04/15/20 1120    Subjective Pt reports knee is doing well and feels that motion is getting better. Reports some soreness from walking over the weekend.  Continued compliance w/ HEP.    Pertinent History Pt is 45 y.o female s/p ACL reconstruction 03/18/20 autograft with L quad tendon. Pain currently 4/10 Worst 8/10 Best 3/10 on NPRS. Pt is able to WBAT per acl rehab protocol, with locked ext brace for 2 weeks (including sleep). Pain can be increased with prolonged walking/standing. Uses ice and medication to help reduce pain level. Pt currently works at home and still able to perform ADLs while wearing brace with modifications. Pt would like progress acl rehab at a respectable to pace to return to prior level of function with ultimate goal to get back to kickboxing.    Limitations Walking;Standing    How long can you sit comfortably? unlimited    How long can you stand comfortably? as tolerated    How long can you walk comfortably? as tolerate    Diagnostic tests Xray and MRI    Patient Stated Goals Progress ACL rehab at respectable pace and return to kickboxing    Pain Onset 1 to 4 weeks ago    Pain Onset More than a month ago           Therex     Recumbent bike 5 min ; seat 12; active assisted ROM going back and forth for gentle ROM; with brace   PROM flexion heel slides 3x10; with slight overpressure; pillow case placed under heel for  less friction   AROM flexion heel slides 3x10; with slight overpressure; pillow case under heel for reduced friction   SL leg press w/ LLE 20# 3x 10 0-45 degrees w/ min cuing to not lock out knee   Alt walking lunge  3x10; 0-45 degrees; cuing to track knee in line with foot with good carry over   SL step up to green step  3x 10; cuing to track knee in line with foot with good carry over; focus on eccentric motion w/ light heel touch to CLE   Manual Therapy   Patellar mobilizations of superior/inferior/medial glides                           PT Education - 04/15/20 1122    Education Details therex form/technique    Person(s) Educated Patient    Methods  Explanation;Demonstration;Verbal cues    Comprehension Verbalized understanding;Returned demonstration;Verbal cues required            PT Short Term Goals - 03/21/20 1535      PT SHORT TERM GOAL #1   Title Pt will be independent with HEP in order to maintain current strength to further progress therex following rehab protocol.    Baseline 03/21/20    Time 4    Period Weeks    Status New             PT Long Term Goals - 03/21/20 1536      PT LONG TERM GOAL #1   Title Pt will decrease worst pain as reported on NPRS by at least 3 points in order to demonstrate clinically significant reduction in knee pain    Baseline 03/21/20 8/10    Time 8    Period Weeks    Status New      PT LONG TERM GOAL #2   Title Pt will increase AROM of the knee to WNL in order to complete functional ADLs, sqautting, and normalize gait and stair negotiation    Baseline 03/21/20 Flex 40 ext 4    Time 8    Period Weeks    Status New      PT LONG TERM GOAL #3   Title Pt will demonstrate a 1 RM leg press to 75# for the right knee to demonstrate increased strength in order to complete heavy household ADLs and community participation.    Baseline 03/21/20 unable to complete resistance measure    Time 8    Period Weeks    Status New      PT LONG TERM GOAL #4   Title Patient will demonstrate a 1 RM knee ext of 45# for the right knee in order demonstrate increased strength in order to participate in heavy household ADLs and community participation.    Baseline 05/08/19 unable to complete resistance measure    Time 8    Period Weeks    Status New      PT LONG TERM GOAL #5   Title Patient will increase FOTO score to 67 to demonstrate predicted increase in functional mobility to complete ADLs    Baseline 03/21/20 47    Time 8    Period Weeks    Status New                 Plan - 04/15/20 1146    Clinical Impression Statement PT continued therex progression per rehab protocol for increased L knee ROM  and strength in closed chain movements.  Pt performs all therex following demonstration and cuing with proper technique. Pt continues to demonstrate increases in A/PROM. Pt gait with decreased terminal stance and hip/knee ext, PT will address proper gait mechanics next session and progress therex as able.    Personal Factors and Comorbidities Comorbidity 1;Comorbidity 2;Time since onset of injury/illness/exacerbation    Comorbidities GERD, anxiety    Examination-Activity Limitations Lift;Locomotion Level;Carry;Squat;Stand;Sleep;Bathing    Examination-Participation Restrictions Community Activity;Cleaning    Stability/Clinical Decision Making Evolving/Moderate complexity    Clinical Decision Making Moderate    Rehab Potential Good    PT Frequency 2x / week    PT Duration 8 weeks    PT Treatment/Interventions ADLs/Self Care Home Management;Electrical Stimulation;Moist Heat;Stair training;Therapeutic exercise;Ultrasound;Traction;DME Instruction;Cryotherapy;Functional mobility training;Therapeutic activities;Neuromuscular re-education;Balance training;Patient/family education;Manual techniques;Dry needling;Passive range of motion;Spinal Manipulations;Joint Manipulations;Gait training    PT Next Visit Plan HEP review, progress therex per rehab protocol    PT Home Exercise Plan SLR, Quad sets, Sidelying abd, weight shifting    Consulted and Agree with Plan of Care Patient           Patient will benefit from skilled therapeutic intervention in order to improve the following deficits and impairments:  Abnormal gait,Decreased activity tolerance,Decreased endurance,Decreased balance,Decreased mobility,Difficulty walking,Impaired flexibility,Impaired tone,Postural dysfunction,Decreased coordination,Decreased range of motion,Decreased strength,Increased fascial restricitons,Improper body mechanics,Pain  Visit Diagnosis: Acute pain of right knee     Problem List Patient Active Problem List   Diagnosis  Date Noted  . Menorrhagia 06/17/2016  . Awareness of heartbeats 03/01/2015  . Hypertension 01/01/2015  . Social phobia involving fear of public speaking 32/20/2542  . Acid reflux 08/25/2014  . 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 Apnea, sleep 08/25/2014  . Hypertrophic condition of skin 08/25/2014  . 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 [place], [address] Phone: [phone]   Fax:  [phone]  Name: Amber Coleman MRN: [id number] Date of Birth: [birth year]

## 2020-04-16 ENCOUNTER — Encounter: Payer: Self-pay | Admitting: Physical Therapy

## 2020-04-16 ENCOUNTER — Ambulatory Visit: Payer: BLUE CROSS/BLUE SHIELD | Attending: Orthopaedic Surgery | Admitting: Physical Therapy

## 2020-04-16 DIAGNOSIS — M25561 Pain in right knee: Secondary | ICD-10-CM | POA: Diagnosis present

## 2020-04-16 DIAGNOSIS — M25562 Pain in left knee: Secondary | ICD-10-CM | POA: Insufficient documentation

## 2020-04-16 DIAGNOSIS — M25661 Stiffness of right knee, not elsewhere classified: Secondary | ICD-10-CM | POA: Insufficient documentation

## 2020-04-16 NOTE — Addendum Note (Signed)
Addended by: Kelton Pillar on: 04/16/2020 09:59 AM   Modules accepted: Orders

## 2020-04-16 NOTE — Therapy (Signed)
[facility] [address] [place], [address] Phone: [phone]   Fax:  [phone]  Physical Therapy Treatment  Patient Details  Name: Amber Coleman MRN: [id number] Date of Birth: 10-11-1975 No data recorded  Encounter Date: 04/16/2020   PT End of Session - 04/16/20 1709    Visit Number 9    Number of Visits 17    Date for PT Re-Evaluation 05/16/20    Authorization Type [employer] 30 02/17/20 - 02/15/21    Authorization - Visit Number 11    Authorization - Number of Visits 30    PT Start Time 9563    PT Stop Time 1730    PT Time Calculation (min) 45 min    Activity Tolerance Patient tolerated treatment well    Behavior During Therapy [facility] for tasks assessed/performed           Past Medical History:  Diagnosis Date  . Anxiety   . GERD (gastroesophageal reflux disease)   . Hypertension    situational; no longer needs medication  . Menorrhagia   . Obstructive sleep apnea    uses CPAP    Past Surgical History:  Procedure Laterality Date  . BILATERAL SALPINGECTOMY Bilateral 12/24/2017   Procedure: BILATERAL SALPINGECTOMY;  Surgeon: Ward, Honor Loh, MD;  Location: [facility];  Service: Gynecology;  Laterality: Bilateral;  . BREAST ENHANCEMENT SURGERY Bilateral 05/2017  . BUNIONECTOMY Left 05/13/2016  . BUNIONECTOMY Right 12/2016  . LIPOSUCTION  06/2016  . VAGINAL HYSTERECTOMY N/A 12/24/2017   Procedure: HYSTERECTOMY VAGINAL;  Surgeon: Ward, Honor Loh, MD;  Location: [facility];  Service: Gynecology;  Laterality: N/A;  . VULVECTOMY PARTIAL  12/24/2017   Procedure: VULVECTOMY PARTIAL;  Surgeon: Ward, Honor Loh, MD;  Location: [facility];  Service: Gynecology;;  . wisdom teeth   1996    There were no vitals filed for this visit.   Subjective Assessment - 04/16/20 1651    Subjective Pt continues to report no pain, only when doing certain exercises but subsides.    Pertinent History Pt is 45 y.o female s/p  ACL reconstruction 03/18/20 autograft with L quad tendon. Pain currently 4/10 Worst 8/10 Best 3/10 on NPRS. Pt is able to WBAT per acl rehab protocol, with locked ext brace for 2 weeks (including sleep). Pain can be increased with prolonged walking/standing. Uses ice and medication to help reduce pain level. Pt currently works at home and still able to perform ADLs while wearing brace with modifications. Pt would like progress acl rehab at a respectable to pace to return to prior level of function with ultimate goal to get back to kickboxing.    Limitations Walking;Standing    How long can you sit comfortably? unlimited    How long can you stand comfortably? as tolerated    How long can you walk comfortably? as tolerate    Diagnostic tests Xray and MRI    Patient Stated Goals Progress ACL rehab at respectable pace and return to kickboxing    Pain Onset 1 to 4 weeks ago    Pain Onset More than a month ago            Therex     Recumbent bike 5 min ; seat 12; active assisted ROM going back and forth for gentle ROM; with brace   PROM flexion heel slides 3x10; with slight overpressure; pillow case placed under heel for less friction   AROM flexion heel slides 3x10; with slight  overpressure; pillow case under heel for reduced friction   SL leg press w/ LLE 20# x 10; #25 2x 10 0-45 degrees w/ min cuing to not lock out knee   SL lunge on airex  3x10; 0-45 degrees; cuing to track knee in line with foot with good carry over   Mini squat on airex 3x 10   Manual Therapy   Patellar mobilizations of superior/inferior/medial glides  Gait Training  Treadmill walking; presented with decreased LLE stance time and decreased terminal knee/hip; able to correct with cuing                          PT Education - 04/16/20 1654    Education Details therex form technique    Person(s) Educated Patient    Methods Explanation;Demonstration;Verbal cues    Comprehension Verbalized  understanding;Returned demonstration;Verbal cues required            PT Short Term Goals - 03/21/20 1535      PT SHORT TERM GOAL #1   Title Pt will be independent with HEP in order to maintain current strength to further progress therex following rehab protocol.    Baseline 03/21/20    Time 4    Period Weeks    Status New             PT Long Term Goals - 03/21/20 1536      PT LONG TERM GOAL #1   Title Pt will decrease worst pain as reported on NPRS by at least 3 points in order to demonstrate clinically significant reduction in knee pain    Baseline 03/21/20 8/10    Time 8    Period Weeks    Status New      PT LONG TERM GOAL #2   Title Pt will increase AROM of the knee to WNL in order to complete functional ADLs, sqautting, and normalize gait and stair negotiation    Baseline 03/21/20 Flex 40 ext 4    Time 8    Period Weeks    Status New      PT LONG TERM GOAL #3   Title Pt will demonstrate a 1 RM leg press to 75# for the right knee to demonstrate increased strength in order to complete heavy household ADLs and community participation.    Baseline 03/21/20 unable to complete resistance measure    Time 8    Period Weeks    Status New      PT LONG TERM GOAL #4   Title Patient will demonstrate a 1 RM knee ext of 45# for the right knee in order demonstrate increased strength in order to participate in heavy household ADLs and community participation.    Baseline 05/08/19 unable to complete resistance measure    Time 8    Period Weeks    Status New      PT LONG TERM GOAL #5   Title Patient will increase FOTO score to 67 to demonstrate predicted increase in functional mobility to complete ADLs    Baseline 03/21/20 47    Time 8    Period Weeks    Status New                 Plan - 04/16/20 1718    Clinical Impression Statement PT continued therex progression per rehab protocol for increased L knee ROM and strength. Pt performed all therex following demonstration and  cuing with proper technique. PT assessed gait with  pt demonstrating decreased stance time and decreased terminal knee/hip ext, patient was able correct with cuing. Pt had no increases in pain level and good motivation throughout session. PT will continue to progress as able.    Personal Factors and Comorbidities Comorbidity 1;Comorbidity 2;Time since onset of injury/illness/exacerbation    Comorbidities GERD, anxiety    Examination-Activity Limitations Lift;Locomotion Level;Carry;Squat;Stand;Sleep;Bathing    Examination-Participation Restrictions Community Activity;Cleaning    Stability/Clinical Decision Making Evolving/Moderate complexity    Clinical Decision Making Moderate    Rehab Potential Good    PT Frequency 2x / week    PT Duration 8 weeks    PT Treatment/Interventions ADLs/Self Care Home Management;Electrical Stimulation;Moist Heat;Stair training;Therapeutic exercise;Ultrasound;Traction;DME Instruction;Cryotherapy;Functional mobility training;Therapeutic activities;Neuromuscular re-education;Balance training;Patient/family education;Manual techniques;Dry needling;Passive range of motion;Spinal Manipulations;Joint Manipulations;Gait training    PT Next Visit Plan HEP review, progress therex per rehab protocol    PT Home Exercise Plan SLR, Quad sets, Sidelying abd, weight shifting    Consulted and Agree with Plan of Care Patient           Patient will benefit from skilled therapeutic intervention in order to improve the following deficits and impairments:  Abnormal gait,Decreased activity tolerance,Decreased endurance,Decreased balance,Decreased mobility,Difficulty walking,Impaired flexibility,Impaired tone,Postural dysfunction,Decreased coordination,Decreased range of motion,Decreased strength,Increased fascial restricitons,Improper body mechanics,Pain  Visit Diagnosis: Acute pain of left knee  Stiffness of right knee, not elsewhere classified     Problem List Patient Active  Problem List   Diagnosis Date Noted  . Menorrhagia 06/17/2016  . Awareness of heartbeats 03/01/2015  . Hypertension 01/01/2015  . Social phobia involving fear of public speaking 38/88/2800  . Acid reflux 08/25/2014  . 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 Apnea, sleep 08/25/2014  . Hypertrophic condition of skin 08/25/2014  . 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 [place], [address] Phone: [phone]   Fax:  [phone]  Name: Carolin Quang MRN: [id number] Date of Birth: 11-Aug-1975

## 2020-04-16 NOTE — Addendum Note (Signed)
Addended by: Kelton Pillar on: 04/16/2020 09:40 AM   Modules accepted: Orders

## 2020-04-17 ENCOUNTER — Encounter: Payer: BLUE CROSS/BLUE SHIELD | Admitting: Physical Therapy

## 2020-04-22 ENCOUNTER — Encounter: Payer: BLUE CROSS/BLUE SHIELD | Admitting: Physical Therapy

## 2020-04-24 ENCOUNTER — Encounter: Payer: Self-pay | Admitting: Physical Therapy

## 2020-04-24 ENCOUNTER — Ambulatory Visit: Payer: BLUE CROSS/BLUE SHIELD | Admitting: Physical Therapy

## 2020-04-24 ENCOUNTER — Other Ambulatory Visit: Payer: Self-pay

## 2020-04-24 DIAGNOSIS — M25562 Pain in left knee: Secondary | ICD-10-CM

## 2020-04-24 DIAGNOSIS — M25561 Pain in right knee: Secondary | ICD-10-CM

## 2020-04-24 DIAGNOSIS — M25661 Stiffness of right knee, not elsewhere classified: Secondary | ICD-10-CM

## 2020-04-24 NOTE — Therapy (Signed)
[facility] [address], [place], [address] Phone: [phone]   Fax:  [phone]  Physical Therapy Treatment  Patient Details  Name: Amber Coleman MRN: [id number] Date of Birth: 1975-12-17 No data recorded  Encounter Date: 04/24/2020   PT End of Session - 04/24/20 0956    Visit Number 10    Number of Visits 17    Date for PT Re-Evaluation 05/16/20    Authorization Type BCBS 30 02/17/20 - 02/15/21    Authorization - Visit Number 10    Authorization - Number of Visits 30    PT Start Time 0945    PT Stop Time 1030    PT Time Calculation (min) 45 min    Equipment Utilized During Treatment Left knee immobilizer    Activity Tolerance Patient tolerated treatment well    Behavior During Therapy [facility] for tasks assessed/performed           Past Medical History:  Diagnosis Date  . Anxiety   . GERD (gastroesophageal reflux disease)   . Hypertension    situational; no longer needs medication  . Menorrhagia   . Obstructive sleep apnea    uses CPAP    Past Surgical History:  Procedure Laterality Date  . BILATERAL SALPINGECTOMY Bilateral 12/24/2017   Procedure: BILATERAL SALPINGECTOMY;  Surgeon: Ward, Honor Loh, MD;  Location: [facility] ORS;  Service: Gynecology;  Laterality: Bilateral;  . BREAST ENHANCEMENT SURGERY Bilateral 05/2017  . BUNIONECTOMY Left 05/13/2016  . BUNIONECTOMY Right 12/2016  . LIPOSUCTION  06/2016  . VAGINAL HYSTERECTOMY N/A 12/24/2017   Procedure: HYSTERECTOMY VAGINAL;  Surgeon: Ward, Honor Loh, MD;  Location: [facility] OR[facility];  Service: Gynecology;  Laterality: N/A;  . VULVECTOMY PARTIAL  12/24/2017   Procedure: VULVECTOMY PARTIAL;  Surgeon: Ward, Honor Loh, MD;  Location: [facility] ORS;  Service: Gynecology;;  . wisdom teeth   1996    There were no vitals filed for this visit.   Subjective Assessment - 04/24/20 0954    Subjective Pt reports no pain today. Is completing HEP and doing well  without the brace.    Pertinent History Pt is 45 y.o female s/p ACL reconstruction 03/18/20 autograft with L quad tendon. Pain currently 4/10 Worst 8/10 Best 3/10 on NPRS. Pt is able to WBAT per acl rehab protocol, with locked ext brace for 2 weeks (including sleep). Pain can be increased with prolonged walking/standing. Uses ice and medication to help reduce pain level. Pt currently works at home and still able to perform ADLs while wearing brace with modifications. Pt would like progress acl rehab at a respectable to pace to return to prior level of function with ultimate goal to get back to kickboxing.    Limitations Walking;Standing    How long can you sit comfortably? unlimited    How long can you stand comfortably? as tolerated    How long can you walk comfortably? as tolerate    Diagnostic tests Xray and MRI    Patient Stated Goals Progress ACL rehab at respectable pace and return to kickboxing    Pain Onset 1 to 4 weeks ago    Pain Onset More than a month ago               Therex   Recumbent bike 5 min ; seat 12; active assisted ROM going back and forth for gentle ROM; with brace  Heel slide with bluTB position in minimal range 40-90d flex 2x 10  Heel prop 43min   SL leg press w/ LLE #25 x10; 35# x10; 45# x10 0-45 degrees w/ min cuing to not lock out kneeg  LLE step up onto 6in step without RLE push off 3x 10 with min cuing for technique with good carry over  MATRIX hip abd 40# 2x 10 bilat with good stability with LLE standing   Manual Therapy Patellar mobilizations of superior/inferior/medial glides          PT Education - 04/24/20 0955    Education Details therex form/technique    Person(s) Educated Patient    Methods Explanation;Demonstration;Verbal cues    Comprehension Verbalized understanding;Returned demonstration;Verbal cues required            PT Short Term Goals - 03/21/20 1535      PT SHORT TERM GOAL #1   Title Pt will be independent  with HEP in order to maintain current strength to further progress therex following rehab protocol.    Baseline 03/21/20    Time 4    Period Weeks    Status New             PT Long Term Goals - 03/21/20 1536      PT LONG TERM GOAL #1   Title Pt will decrease worst pain as reported on NPRS by at least 3 points in order to demonstrate clinically significant reduction in knee pain    Baseline 03/21/20 8/10    Time 8    Period Weeks    Status New      PT LONG TERM GOAL #2   Title Pt will increase AROM of the knee to WNL in order to complete functional ADLs, sqautting, and normalize gait and stair negotiation    Baseline 03/21/20 Flex 40 ext 4    Time 8    Period Weeks    Status New      PT LONG TERM GOAL #3   Title Pt will demonstrate a 1 RM leg press to 75# for the right knee to demonstrate increased strength in order to complete heavy household ADLs and community participation.    Baseline 03/21/20 unable to complete resistance measure    Time 8    Period Weeks    Status New      PT LONG TERM GOAL #4   Title Patient will demonstrate a 1 RM knee ext of 45# for the right knee in order demonstrate increased strength in order to participate in heavy household ADLs and community participation.    Baseline 05/08/19 unable to complete resistance measure    Time 8    Period Weeks    Status New      PT LONG TERM GOAL #5   Title Patient will increase FOTO score to 67 to demonstrate predicted increase in functional mobility to complete ADLs    Baseline 03/21/20 47    Time 8    Period Weeks    Status New                 Plan - 04/24/20 1022    Clinical Impression Statement PT continued therex progression per protocol for increased L knee mobility and strength. Pt is able to comply with all cuing for proper technique of therex with good motivation and no pain throughout session. Patient with some increased tension of quadricep group, with some cordant soreness at patellar tendon,  advised heat for this with understanding. PT will continue progression as able.    Personal Factors and Comorbidities  Comorbidity 1;Comorbidity 2;Time since onset of injury/illness/exacerbation    Comorbidities GERD, anxiety    Examination-Activity Limitations Lift;Locomotion Level;Carry;Squat;Stand;Sleep;Bathing    Stability/Clinical Decision Making Evolving/Moderate complexity    Clinical Decision Making Moderate    Rehab Potential Good    PT Frequency 2x / week    PT Duration 8 weeks    PT Treatment/Interventions ADLs/Self Care Home Management;Electrical Stimulation;Moist Heat;Stair training;Therapeutic exercise;Ultrasound;Traction;DME Instruction;Cryotherapy;Functional mobility training;Therapeutic activities;Neuromuscular re-education;Balance training;Patient/family education;Manual techniques;Dry needling;Passive range of motion;Spinal Manipulations;Joint Manipulations;Gait training    PT Next Visit Plan HEP review, progress therex per rehab protocol    PT Home Exercise Plan SLR, Quad sets, Sidelying abd, weight shifting    Consulted and Agree with Plan of Care Patient           Patient will benefit from skilled therapeutic intervention in order to improve the following deficits and impairments:  Abnormal gait,Decreased activity tolerance,Decreased endurance,Decreased balance,Decreased mobility,Difficulty walking,Impaired flexibility,Impaired tone,Postural dysfunction,Decreased coordination,Decreased range of motion,Decreased strength,Increased fascial restricitons,Improper body mechanics,Pain  Visit Diagnosis: Acute pain of left knee  Stiffness of right knee, not elsewhere classified  Acute pain of right knee     Problem List Patient Active Problem List   Diagnosis Date Noted  . Menorrhagia 06/17/2016  . Awareness of heartbeats 03/01/2015  . Hypertension 01/01/2015  . Social phobia involving fear of public speaking 26/71/2458  . Acid reflux 08/25/2014  . Achrochordon  08/25/2014  . Apnea, sleep 08/25/2014  . Hypertrophic condition of skin 08/25/2014  . 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, [place], [address] Phone: [phone]   Fax:  [phone]  Name: Amber Coleman MRN: [id number] Date of Birth: 01-24-1976

## 2020-04-29 ENCOUNTER — Encounter: Payer: BLUE CROSS/BLUE SHIELD | Admitting: Physical Therapy

## 2020-04-30 ENCOUNTER — Other Ambulatory Visit: Payer: Self-pay

## 2020-04-30 ENCOUNTER — Ambulatory Visit: Payer: BLUE CROSS/BLUE SHIELD | Admitting: Physical Therapy

## 2020-04-30 ENCOUNTER — Encounter: Payer: Self-pay | Admitting: Physical Therapy

## 2020-04-30 DIAGNOSIS — M25661 Stiffness of right knee, not elsewhere classified: Secondary | ICD-10-CM

## 2020-04-30 DIAGNOSIS — M25562 Pain in left knee: Secondary | ICD-10-CM

## 2020-04-30 DIAGNOSIS — M25561 Pain in right knee: Secondary | ICD-10-CM

## 2020-04-30 NOTE — Therapy (Signed)
[facility] [address] [place], [address] Phone: [phone]   Fax:  [phone]  Physical Therapy Treatment  Patient Details  Name: Amber Coleman MRN: [id number] Date of Birth: Nov 15, 1975 No data recorded  Encounter Date: 04/30/2020   PT End of Session - 04/30/20 1536    Visit Number 11    Number of Visits 17    Date for PT Re-Evaluation 05/16/20    Authorization Type BCBS 30 02/17/20 - 02/15/21    Authorization - Visit Number 11    Authorization - Number of Visits 30    PT Start Time 0315    PT Stop Time 0355    PT Time Calculation (min) 40 min    Equipment Utilized During Treatment Left knee immobilizer    Activity Tolerance Patient tolerated treatment well    Behavior During Therapy [facility] for tasks assessed/performed           Past Medical History:  Diagnosis Date  . Anxiety   . GERD (gastroesophageal reflux disease)   . Hypertension    situational; no longer needs medication  . Menorrhagia   . Obstructive sleep apnea    uses CPAP    Past Surgical History:  Procedure Laterality Date  . BILATERAL SALPINGECTOMY Bilateral 12/24/2017   Procedure: BILATERAL SALPINGECTOMY;  Surgeon: Ward, Honor Loh, MD;  Location: [facility];  Service: Gynecology;  Laterality: Bilateral;  . BREAST ENHANCEMENT SURGERY Bilateral 05/2017  . BUNIONECTOMY Left 05/13/2016  . BUNIONECTOMY Right 12/2016  . LIPOSUCTION  06/2016  . VAGINAL HYSTERECTOMY N/A 12/24/2017   Procedure: HYSTERECTOMY VAGINAL;  Surgeon: Ward, Honor Loh, MD;  Location: [facility];  Service: Gynecology;  Laterality: N/A;  . VULVECTOMY PARTIAL  12/24/2017   Procedure: VULVECTOMY PARTIAL;  Surgeon: Ward, Honor Loh, MD;  Location: [facility];  Service: Gynecology;;  . wisdom teeth   1996    There were no vitals filed for this visit.   Subjective Assessment - 04/30/20 1517    Subjective Pt reports no pain today. Went for a 1.7 mile walk Sunday that  went well, does feel like she is "babying" her LLE some. Completing current HEP. Reports some tension in the knee today, that it just feels tight when she bends it.    Pertinent History Pt is 45 y.o female s/p ACL reconstruction 03/18/20 autograft with L quad tendon. Pain currently 4/10 Worst 8/10 Best 3/10 on NPRS. Pt is able to WBAT per acl rehab protocol, with locked ext brace for 2 weeks (including sleep). Pain can be increased with prolonged walking/standing. Uses ice and medication to help reduce pain level. Pt currently works at home and still able to perform ADLs while wearing brace with modifications. Pt would like progress acl rehab at a respectable to pace to return to prior level of function with ultimate goal to get back to kickboxing.    Limitations Walking;Standing    How long can you sit comfortably? unlimited    How long can you stand comfortably? as tolerated    How long can you walk comfortably? as tolerate    Diagnostic tests Xray and MRI    Patient Stated Goals Progress ACL rehab at respectable pace and return to kickboxing    Pain Onset 1 to 4 weeks ago    Pain Onset More than a month ago            Therex Recumbent bike 5 min ; seat 12; active assisted ROM  going back and forth for gentle ROM; with brace  Squat to chair with RLE on balance stone 3x 10/9/8 with cuing initially , good carry over following  Walking lunge 41ft x2 with pt getting close to 90d  Alt lateral lunge 3x 12 (6 each way) with good carry over of demo   LLE step up onto 8in step without RLE push off 3x 8 with min cuing for eccentric control with good carry over  SL leg press w/ LLE only 35# x8/7/60-90 degrees w/ min cuing for full knee ext   Hamstring stretch 30sec hold Quad stretch propped on chair 30sec hold   PT reviewed the following HEP with patient with patient able to demonstrate a set of the following with min cuing for correction needed. PT educated patient on parameters of therex  (how/when to inc/decrease intensity, frequency, rep/set range, stretch hold time, and purpose of therex) with verbalized understanding.  Access Code: [email or web address] Squat with Chair Touch - 1 x daily - 2-3 x weekly - 3 sets - 10 reps Lateral Lunge - 1 x daily - 2-3 x weekly - 3 sets - 10 reps Supine Active Straight Leg Raise - 1 x daily - 2-3 x weekly - 3 sets - 10 reps Hip Abduction with Resistance Loop - 1 x daily - 2-3 x weekly - 3 sets - 10 reps Walking Forward Lunge - 1 x daily - 2-3 x weekly - 3 sets - 10 reps              PT Education - 04/30/20 1535    Education Details therex form/technique    Person(s) Educated Patient    Methods Explanation;Demonstration;Verbal cues    Comprehension Verbalized understanding;Returned demonstration;Verbal cues required            PT Short Term Goals - 03/21/20 1535      PT SHORT TERM GOAL #1   Title Pt will be independent with HEP in order to maintain current strength to further progress therex following rehab protocol.    Baseline 03/21/20    Time 4    Period Weeks    Status New             PT Long Term Goals - 03/21/20 1536      PT LONG TERM GOAL #1   Title Pt will decrease worst pain as reported on NPRS by at least 3 points in order to demonstrate clinically significant reduction in knee pain    Baseline 03/21/20 8/10    Time 8    Period Weeks    Status New      PT LONG TERM GOAL #2   Title Pt will increase AROM of the knee to WNL in order to complete functional ADLs, sqautting, and normalize gait and stair negotiation    Baseline 03/21/20 Flex 40 ext 4    Time 8    Period Weeks    Status New      PT LONG TERM GOAL #3   Title Pt will demonstrate a 1 RM leg press to 75# for the right knee to demonstrate increased strength in order to complete heavy household ADLs and community participation.    Baseline 03/21/20 unable to complete resistance measure    Time 8    Period Weeks    Status New      PT LONG TERM GOAL #4    Title Patient will demonstrate a 1 RM knee ext of 45# for the right knee in order demonstrate  increased strength in order to participate in heavy household ADLs and community participation.    Baseline 05/08/19 unable to complete resistance measure    Time 8    Period Weeks    Status New      PT LONG TERM GOAL #5   Title Patient will increase FOTO score to 67 to demonstrate predicted increase in functional mobility to complete ADLs    Baseline 03/21/20 47    Time 8    Period Weeks    Status New                 Plan - 04/30/20 1556    Clinical Impression Statement PT continued therex progression with introduction of closed chain therex to 90d per protocol with success. Pt is able to comply with cuing of all therex for proper technique. Patient is some pain at quad tendon that feels "tight" in nature, does not warrant ceasing progression. PT updated HEP to reflect protocol progress with patient demonstrating and verbalizing understanding. PT will continue progression as able.    Personal Factors and Comorbidities Comorbidity 1;Comorbidity 2;Time since onset of injury/illness/exacerbation    Comorbidities GERD, anxiety    Examination-Activity Limitations Lift;Locomotion Level;Carry;Squat;Stand;Sleep;Bathing    Examination-Participation Restrictions Community Activity;Cleaning    Stability/Clinical Decision Making Evolving/Moderate complexity    Clinical Decision Making Moderate    Rehab Potential Good    PT Frequency 2x / week    PT Duration 8 weeks    PT Treatment/Interventions ADLs/Self Care Home Management;Electrical Stimulation;Moist Heat;Stair training;Therapeutic exercise;Ultrasound;Traction;DME Instruction;Cryotherapy;Functional mobility training;Therapeutic activities;Neuromuscular re-education;Balance training;Patient/family education;Manual techniques;Dry needling;Passive range of motion;Spinal Manipulations;Joint Manipulations;Gait training    PT Next Visit Plan HEP review,  progress therex per rehab protocol    PT Home Exercise Plan SLR, Quad sets, Sidelying abd, weight shifting    Consulted and Agree with Plan of Care Patient           Patient will benefit from skilled therapeutic intervention in order to improve the following deficits and impairments:  Abnormal gait,Decreased activity tolerance,Decreased endurance,Decreased balance,Decreased mobility,Difficulty walking,Impaired flexibility,Impaired tone,Postural dysfunction,Decreased coordination,Decreased range of motion,Decreased strength,Increased fascial restricitons,Improper body mechanics,Pain  Visit Diagnosis: Acute pain of left knee  Stiffness of right knee, not elsewhere classified  Acute pain of right knee     Problem List Patient Active Problem List   Diagnosis Date Noted  . Menorrhagia 06/17/2016  . Awareness of heartbeats 03/01/2015  . Hypertension 01/01/2015  . 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 Acid reflux 08/25/2014  . 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 Apnea, sleep 08/25/2014  . Hypertrophic condition of skin 08/25/2014  . 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, [place], [address] Phone: [phone]   Fax:  [phone]  Name: Amber Coleman MRN: [id number] Date of Birth: 1975-12-08

## 2020-05-01 ENCOUNTER — Encounter: Payer: BLUE CROSS/BLUE SHIELD | Admitting: Physical Therapy

## 2020-05-02 ENCOUNTER — Ambulatory Visit: Payer: BLUE CROSS/BLUE SHIELD | Admitting: Physical Therapy

## 2020-05-06 ENCOUNTER — Ambulatory Visit: Payer: BLUE CROSS/BLUE SHIELD | Admitting: Physical Therapy

## 2020-05-08 ENCOUNTER — Other Ambulatory Visit: Payer: Self-pay

## 2020-05-08 ENCOUNTER — Ambulatory Visit: Payer: BLUE CROSS/BLUE SHIELD | Admitting: Physical Therapy

## 2020-05-08 ENCOUNTER — Encounter: Payer: Self-pay | Admitting: Physical Therapy

## 2020-05-08 DIAGNOSIS — M25562 Pain in left knee: Secondary | ICD-10-CM | POA: Diagnosis not present

## 2020-05-08 NOTE — Therapy (Signed)
[facility] [address], [place], [address] Phone: [phone]   Fax:  [phone]  Physical Therapy Treatment  Patient Details  Name: Amber Coleman MRN: [id number] Date of Birth: [birth year] No data recorded  Encounter Date: 05/08/2020    Past Medical History:  Diagnosis Date  . Anxiety   . GERD (gastroesophageal reflux disease)   . Hypertension    situational; no longer needs medication  . Menorrhagia   . Obstructive sleep apnea    uses CPAP    Past Surgical History:  Procedure Laterality Date  . BILATERAL SALPINGECTOMY Bilateral 12/24/2017   Procedure: BILATERAL SALPINGECTOMY;  Surgeon: Ward, Honor Loh, MD;  Location: [facility];  Service: Gynecology;  Laterality: Bilateral;  . BREAST ENHANCEMENT SURGERY Bilateral 05/2017  . BUNIONECTOMY Left 05/13/2016  . BUNIONECTOMY Right 12/2016  . LIPOSUCTION  06/2016  . VAGINAL HYSTERECTOMY N/A 12/24/2017   Procedure: HYSTERECTOMY VAGINAL;  Surgeon: Ward, Honor Loh, MD;  Location: [facility];  Service: Gynecology;  Laterality: N/A;  . VULVECTOMY PARTIAL  12/24/2017   Procedure: VULVECTOMY PARTIAL;  Surgeon: Ward, Honor Loh, MD;  Location: [facility];  Service: Gynecology;;  . wisdom teeth   1996    There were no vitals filed for this visit.   Therex Recumbent bike 5 min ; seat 12; active assisted ROM going back and forth for gentle ROM  Squat to chair with RLE on balance stone 3x 10 with cuing initially to sit lower and keep weight off RLE , good carry over following  Walking lunge 32ft x2 with 5 # DB bilaterally with pt getting close to 90 - medium/ hard with stretch at bottom of lunge   Alt lateral lunge 1x12 With RTB (6 each way) with good carry over of demo, 2 sets of 12 with GTB   LLE step up onto12in step without RLE push off and lateral step down 3x10 with min cuing for eccentric control with good carry over  SL leg press w/ LLE only 45#  x8/6/40-90 degrees w/ min cuing for full knee ext, reported tension when pushing but no pain   Hamstring stretch 30sec hold both legs Quad stretch propped on chair 30sec hold both legs    Pt reported HEP going well and will continue with current HEP adding walking and biking to tolerance.                            PT Short Term Goals - 03/21/20 1535      PT SHORT TERM GOAL #1   Title Pt will be independent with HEP in order to maintain current strength to further progress therex following rehab protocol.    Baseline 03/21/20    Time 4    Period Weeks    Status New             PT Long Term Goals - 03/21/20 1536      PT LONG TERM GOAL #1   Title Pt will decrease worst pain as reported on NPRS by at least 3 points in order to demonstrate clinically significant reduction in knee pain    Baseline 03/21/20 8/10    Time 8    Period Weeks    Status New      PT LONG TERM GOAL #2   Title Pt will increase AROM of the knee to WNL in order to complete functional ADLs, sqautting, and normalize gait  and stair negotiation    Baseline 03/21/20 Flex 40 ext 4    Time 8    Period Weeks    Status New      PT LONG TERM GOAL #3   Title Pt will demonstrate a 1 RM leg press to 75# for the right knee to demonstrate increased strength in order to complete heavy household ADLs and community participation.    Baseline 03/21/20 unable to complete resistance measure    Time 8    Period Weeks    Status New      PT LONG TERM GOAL #4   Title Patient will demonstrate a 1 RM knee ext of 45# for the right knee in order demonstrate increased strength in order to participate in heavy household ADLs and community participation.    Baseline 05/08/19 unable to complete resistance measure    Time 8    Period Weeks    Status New      PT LONG TERM GOAL #5   Title Patient will increase FOTO score to 67 to demonstrate predicted increase in functional mobility to complete ADLs    Baseline  03/21/20 47    Time 8    Period Weeks    Status New                  Patient will benefit from skilled therapeutic intervention in order to improve the following deficits and impairments:     Visit Diagnosis: No diagnosis found.     Problem List Patient Active Problem List   Diagnosis Date Noted  . Menorrhagia 06/17/2016  . Awareness of heartbeats 03/01/2015  . Hypertension 01/01/2015  . 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 Acid reflux 08/25/2014  . 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 Apnea, sleep 08/25/2014  . Hypertrophic condition of skin 08/25/2014  . 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, [place], [address] Phone: [phone]   Fax:  [phone]  Name: Amber Coleman MRN: [id number] Date of Birth: [birth year]

## 2020-05-13 ENCOUNTER — Other Ambulatory Visit: Payer: Self-pay

## 2020-05-13 ENCOUNTER — Ambulatory Visit: Payer: BLUE CROSS/BLUE SHIELD

## 2020-05-13 DIAGNOSIS — M25562 Pain in left knee: Secondary | ICD-10-CM | POA: Diagnosis not present

## 2020-05-13 DIAGNOSIS — M25661 Stiffness of right knee, not elsewhere classified: Secondary | ICD-10-CM

## 2020-05-13 DIAGNOSIS — M25561 Pain in right knee: Secondary | ICD-10-CM

## 2020-05-13 NOTE — Therapy (Signed)
[name] [address], [place], [address] Phone: [phone]   Fax:  [phone]  Physical Therapy Treatment  Patient Details  Name: Amber Coleman MRN: [id number] Date of Birth: 10/31/1975 No data recorded  Encounter Date: 05/13/2020   PT End of Session - 05/13/20 0908    Visit Number 13    Number of Visits 17    Date for PT Re-Evaluation 05/16/20    Authorization Type BCBS 30 02/17/20 - 02/15/21    Authorization Time Period 03/21/20-06/13/20    Authorization - Visit Number 13    Authorization - Number of Visits 30    PT Start Time 0903    PT Stop Time 0941    PT Time Calculation (min) 38 min    Activity Tolerance Patient tolerated treatment well;No increased pain    Behavior During Therapy WFL for tasks assessed/performed           Past Medical History:  Diagnosis Date  . Anxiety   . GERD (gastroesophageal reflux disease)   . Hypertension    situational; no longer needs medication  . Menorrhagia   . Obstructive sleep apnea    uses CPAP    Past Surgical History:  Procedure Laterality Date  . BILATERAL SALPINGECTOMY Bilateral 12/24/2017   Procedure: BILATERAL SALPINGECTOMY;  Surgeon: Ward, Honor Loh, MD;  Location: [facility];  Service: Gynecology;  Laterality: Bilateral;  . BREAST ENHANCEMENT SURGERY Bilateral 05/2017  . BUNIONECTOMY Left 05/13/2016  . BUNIONECTOMY Right 12/2016  . LIPOSUCTION  06/2016  . VAGINAL HYSTERECTOMY N/A 12/24/2017   Procedure: HYSTERECTOMY VAGINAL;  Surgeon: Ward, Honor Loh, MD;  Location: [facility];  Service: Gynecology;  Laterality: N/A;  . VULVECTOMY PARTIAL  12/24/2017   Procedure: VULVECTOMY PARTIAL;  Surgeon: Ward, Honor Loh, MD;  Location: [facility];  Service: Gynecology;;  . wisdom teeth   1996    There were no vitals filed for this visit.   Subjective Assessment - 05/13/20 0907    Subjective Pt doing well today. Had allergy flare up last week. Pt doing  well in general, knee remains stiff in mornings. No pain. HEP remains on track for success.    Pertinent History Pt is 45 y.o female s/p ACL reconstruction 03/18/20 autograft with L quad tendon. Pain currently 4/10 Worst 8/10 Best 3/10 on NPRS. Pt is able to WBAT per acl rehab protocol, with locked ext brace for 2 weeks (including sleep). Pain can be increased with prolonged walking/standing. Uses ice and medication to help reduce pain level. Pt currently works at home and still able to perform ADLs while wearing brace with modifications. Pt would like progress acl rehab at a respectable to pace to return to prior level of function with ultimate goal to get back to kickboxing.    Currently in Pain? No/denies           INTERVENTION THIS DATE:   -Recumbent bike 5 min ; seat 12; active assisted ROM going back and forth for gentle ROM -SL leg press *25lb WU set 1x15 (each)  *35lb 3x10 bilat  -Eyes closes SLS RLE (screening for uninvolved side) -LLE SLS 15x5secH (rest each 5 reps)   -Hamstring stretch 2x30sec LLE (slight knee flexion to avoid recurvatum)  -Standing rectus stretch foot propped on chair+airex+towel 30sec hold both legs   -SLS firm surface shod trunk/phyioball rotation 2x30sec bilat      PT Short Term Goals - 03/21/20 1535      PT  SHORT TERM GOAL #1   Title Pt will be independent with HEP in order to maintain current strength to further progress therex following rehab protocol.    Baseline 03/21/20    Time 4    Period Weeks    Status New             PT Long Term Goals - 03/21/20 1536      PT LONG TERM GOAL #1   Title Pt will decrease worst pain as reported on NPRS by at least 3 points in order to demonstrate clinically significant reduction in knee pain    Baseline 03/21/20 8/10    Time 8    Period Weeks    Status New      PT LONG TERM GOAL #2   Title Pt will increase AROM of the knee to WNL in order to complete functional ADLs, sqautting, and normalize gait and  stair negotiation    Baseline 03/21/20 Flex 40 ext 4    Time 8    Period Weeks    Status New      PT LONG TERM GOAL #3   Title Pt will demonstrate a 1 RM leg press to 75# for the right knee to demonstrate increased strength in order to complete heavy household ADLs and community participation.    Baseline 03/21/20 unable to complete resistance measure    Time 8    Period Weeks    Status New      PT LONG TERM GOAL #4   Title Patient will demonstrate a 1 RM knee ext of 45# for the right knee in order demonstrate increased strength in order to participate in heavy household ADLs and community participation.    Baseline 05/08/19 unable to complete resistance measure    Time 8    Period Weeks    Status New      PT LONG TERM GOAL #5   Title Patient will increase FOTO score to 67 to demonstrate predicted increase in functional mobility to complete ADLs    Baseline 03/21/20 47    Time 8    Period Weeks    Status New                 Plan - 05/13/20 0910    Clinical Impression Statement Continued with current plan of care as laid out in evaluation and recent prior sessions. Integrated single limb motor control training this date- revealing of difficulty at Left hip external rotators and general fatigue before 30sec. Pt remains motivated to advance progress toward goals. Rest breaks provided as needed, pt quick to ask when needed. Author maintains all interventions within appropriate level of intensity as not to purposefully exacerbate pain. Pt does require varying levels of assistance and cuing for completion of exercises for correct form and sometimes due to pain/weakness. Pt continues to demonstrate progress toward goals AEB progression of some interventions this date either in volume or intensity. No updates to HEP this date.    Personal Factors and Comorbidities Comorbidity 1;Comorbidity 2;Time since onset of injury/illness/exacerbation    Comorbidities GERD, anxiety     Examination-Activity Limitations Lift;Locomotion Level;Carry;Squat;Stand;Sleep;Bathing    Examination-Participation Restrictions Community Activity;Cleaning    Stability/Clinical Decision Making Evolving/Moderate complexity    Clinical Decision Making Moderate    Rehab Potential Good    PT Frequency 2x / week    PT Duration 8 weeks    PT Treatment/Interventions ADLs/Self Care Home Management;Electrical Stimulation;Moist Heat;Stair training;Therapeutic exercise;Ultrasound;Traction;DME Instruction;Cryotherapy;Functional mobility training;Therapeutic activities;Neuromuscular re-education;Balance training;Patient/family  education;Manual techniques;Dry needling;Passive range of motion;Spinal Manipulations;Joint Manipulations;Gait training    PT Next Visit Plan HEP review, progress therex per rehab protocol    PT Home Exercise Plan Squat with Chair Touch, Lateral Lunge, Supine Active Straight Leg Raise, Hip Abduction with Resistance Loop, Walking Forward Lunge    Consulted and Agree with Plan of Care Patient           Patient will benefit from skilled therapeutic intervention in order to improve the following deficits and impairments:  Abnormal gait,Decreased activity tolerance,Decreased endurance,Decreased balance,Decreased mobility,Difficulty walking,Impaired flexibility,Impaired tone,Postural dysfunction,Decreased coordination,Decreased range of motion,Decreased strength,Increased fascial restricitons,Improper body mechanics,Pain  Visit Diagnosis: Acute pain of left knee  Stiffness of right knee, not elsewhere classified  Acute pain of right knee     Problem List Patient Active Problem List   Diagnosis Date Noted  . Menorrhagia 06/17/2016  . Awareness of heartbeats 03/01/2015  . Hypertension 01/01/2015  . 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 Acid reflux 08/25/2014  . 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 Apnea, sleep 08/25/2014  . Hypertrophic condition of skin  08/25/2014  . 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 [place], [address] Phone: [phone]   Fax:  [phone]  Name: Kaelie Henigan MRN: [id number] Date of Birth: 11-Apr-1975

## 2020-05-15 ENCOUNTER — Ambulatory Visit: Payer: BLUE CROSS/BLUE SHIELD | Admitting: Physical Therapy

## 2020-05-20 ENCOUNTER — Other Ambulatory Visit: Payer: Self-pay

## 2020-05-20 ENCOUNTER — Encounter: Payer: Self-pay | Admitting: Physical Therapy

## 2020-05-20 ENCOUNTER — Ambulatory Visit: Payer: BLUE CROSS/BLUE SHIELD | Attending: Orthopaedic Surgery | Admitting: Physical Therapy

## 2020-05-20 DIAGNOSIS — M25562 Pain in left knee: Secondary | ICD-10-CM | POA: Diagnosis not present

## 2020-05-20 DIAGNOSIS — M25561 Pain in right knee: Secondary | ICD-10-CM | POA: Diagnosis present

## 2020-05-20 DIAGNOSIS — M25661 Stiffness of right knee, not elsewhere classified: Secondary | ICD-10-CM | POA: Diagnosis present

## 2020-05-20 NOTE — Therapy (Signed)
[facility] [address] [place], [address] Phone: [phone]   Fax:  [phone]  Physical Therapy Treatment  Patient Details  Name: Amber Coleman MRN: [id number] Date of Birth: 1975/09/09 No data recorded  Encounter Date: 05/20/2020   PT End of Session - 05/20/20 0940    Visit Number 14    Number of Visits 17    Date for PT Re-Evaluation 05/16/20    Authorization Type [employer]BS 30 02/17/20 - 02/15/21    Authorization Time Period 03/21/20-06/13/20    Authorization - Visit Number 14    Authorization - Number of Visits 30    PT Start Time 0900    PT Stop Time 0940    PT Time Calculation (min) 40 min    Equipment Utilized During Treatment Left knee immobilizer    Activity Tolerance Patient tolerated treatment well;No increased pain    Behavior During Therapy WFL for tasks assessed/performed           Past Medical History:  Diagnosis Date  . Anxiety   . GERD (gastroesophageal reflux disease)   . Hypertension    situational; no longer needs medication  . Menorrhagia   . Obstructive sleep apnea    uses CPAP    Past Surgical History:  Procedure Laterality Date  . BILATERAL SALPINGECTOMY Bilateral 12/24/2017   Procedure: BILATERAL SALPINGECTOMY;  Surgeon: Ward, Honor Loh, MD;  Location: [facility];  Service: Gynecology;  Laterality: Bilateral;  . BREAST ENHANCEMENT SURGERY Bilateral 05/2017  . BUNIONECTOMY Left 05/13/2016  . BUNIONECTOMY Right 12/2016  . LIPOSUCTION  06/2016  . VAGINAL HYSTERECTOMY N/A 12/24/2017   Procedure: HYSTERECTOMY VAGINAL;  Surgeon: Ward, Honor Loh, MD;  Location: [facility];  Service: Gynecology;  Laterality: N/A;  . VULVECTOMY PARTIAL  12/24/2017   Procedure: VULVECTOMY PARTIAL;  Surgeon: Ward, Honor Loh, MD;  Location: [facility];  Service: Gynecology;;  . wisdom teeth   1996    There were no vitals filed for this visit.   Subjective Assessment - 05/20/20 0905    Subjective Pt  is doing well today, no pain. Reports she is a little sore from completing spring cleaning over the weekend. Completing HEP with no issues    Pertinent History Pt is 45 y.o female s/p ACL reconstruction 03/18/20 autograft with L quad tendon. Pain currently 4/10 Worst 8/10 Best 3/10 on NPRS. Pt is able to WBAT per acl rehab protocol, with locked ext brace for 2 weeks (including sleep). Pain can be increased with prolonged walking/standing. Uses ice and medication to help reduce pain level. Pt currently works at home and still able to perform ADLs while wearing brace with modifications. Pt would like progress acl rehab at a respectable to pace to return to prior level of function with ultimate goal to get back to kickboxing.    Limitations Walking;Standing    How long can you sit comfortably? unlimited    How long can you stand comfortably? as tolerated    How long can you walk comfortably? as tolerate    Diagnostic tests Xray and MRI    Patient Stated Goals Progress ACL rehab at respectable pace and return to kickboxing    Pain Onset 1 to 4 weeks ago    Pain Onset More than a month ago           INTERVENTION THIS DATE:   -Recumbent bike 5 min ; seat 8 L2-3 for low load resistance  - Lateral lunge  x12; with 10# DB 2x 12 with minimal corrections needed - Alt lunge stepping onto foam 3x 12 with min cuing for technique with good carry over -TRX SL squat in minimal depth with focus on glute activation with full knee ext for stand 3x 10 -SL leg press 35lb 3x10 with min cuing for quad activation with good carry over -SLS on foam eyes closed   -Hamstring stretch 2x30sec LLE (slight knee flexion to avoid recurvatum)  -Standing rectus stretch foot propped on chair+airex+towel 30sec holdboth legs  -SLS firm surface shod trunk/phyioball rotation 2x30sec bilat                             PT Education - 05/20/20 0915    Education Details therex form/technique    Person(s)  Educated Patient    Methods Explanation;Demonstration;Verbal cues    Comprehension Verbalized understanding;Returned demonstration;Verbal cues required            PT Short Term Goals - 03/21/20 1535      PT SHORT TERM GOAL #1   Title Pt will be independent with HEP in order to maintain current strength to further progress therex following rehab protocol.    Baseline 03/21/20    Time 4    Period Weeks    Status New             PT Long Term Goals - 03/21/20 1536      PT LONG TERM GOAL #1   Title Pt will decrease worst pain as reported on NPRS by at least 3 points in order to demonstrate clinically significant reduction in knee pain    Baseline 03/21/20 8/10    Time 8    Period Weeks    Status New      PT LONG TERM GOAL #2   Title Pt will increase AROM of the knee to WNL in order to complete functional ADLs, sqautting, and normalize gait and stair negotiation    Baseline 03/21/20 Flex 40 ext 4    Time 8    Period Weeks    Status New      PT LONG TERM GOAL #3   Title Pt will demonstrate a 1 RM leg press to 75# for the right knee to demonstrate increased strength in order to complete heavy household ADLs and community participation.    Baseline 03/21/20 unable to complete resistance measure    Time 8    Period Weeks    Status New      PT LONG TERM GOAL #4   Title Patient will demonstrate a 1 RM knee ext of 45# for the right knee in order demonstrate increased strength in order to participate in heavy household ADLs and community participation.    Baseline 05/08/19 unable to complete resistance measure    Time 8    Period Weeks    Status New      PT LONG TERM GOAL #5   Title Patient will increase FOTO score to 67 to demonstrate predicted increase in functional mobility to complete ADLs    Baseline 03/21/20 47    Time 8    Period Weeks    Status New                 Plan - 05/20/20 0941    Clinical Impression Statement PT continued POC by protocol advisement with  success. Patient reports some tension at quad tendon, but no pain throughout session. Patient is  able to complete all cuing for proper technique of therex with good motivation throughout session. PT will continue progression as able.    Personal Factors and Comorbidities Comorbidity 1;Comorbidity 2;Time since onset of injury/illness/exacerbation    Comorbidities GERD, anxiety    Examination-Activity Limitations Lift;Locomotion Level;Carry;Squat;Stand;Sleep;Bathing    Examination-Participation Restrictions Community Activity;Cleaning    Stability/Clinical Decision Making Evolving/Moderate complexity    Clinical Decision Making Moderate    Rehab Potential Good    PT Frequency 2x / week    PT Duration 8 weeks    PT Treatment/Interventions ADLs/Self Care Home Management;Electrical Stimulation;Moist Heat;Stair training;Therapeutic exercise;Ultrasound;Traction;DME Instruction;Cryotherapy;Functional mobility training;Therapeutic activities;Neuromuscular re-education;Balance training;Patient/family education;Manual techniques;Dry needling;Passive range of motion;Spinal Manipulations;Joint Manipulations;Gait training    PT Next Visit Plan HEP review, progress therex per rehab protocol    PT Home Exercise Plan Squat with Chair Touch, Lateral Lunge, Supine Active Straight Leg Raise, Hip Abduction with Resistance Loop, Walking Forward Lunge    Consulted and Agree with Plan of Care Patient           Patient will benefit from skilled therapeutic intervention in order to improve the following deficits and impairments:  Abnormal gait,Decreased activity tolerance,Decreased endurance,Decreased balance,Decreased mobility,Difficulty walking,Impaired flexibility,Impaired tone,Postural dysfunction,Decreased coordination,Decreased range of motion,Decreased strength,Increased fascial restricitons,Improper body mechanics,Pain  Visit Diagnosis: Acute pain of left knee  Stiffness of right knee, not elsewhere  classified  Acute pain of right knee     Problem List Patient Active Problem List   Diagnosis Date Noted  . Menorrhagia 06/17/2016  . Awareness of heartbeats 03/01/2015  . Hypertension 01/01/2015  . 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 Acid reflux 08/25/2014  . 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 Apnea, sleep 08/25/2014  . Hypertrophic condition of skin 08/25/2014  . 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, [place], [address] Phone: [phone]   Fax:  [phone]  Name: Amber Coleman MRN: [id number] Date of Birth: Mar 27, 1975

## 2020-05-27 ENCOUNTER — Ambulatory Visit: Payer: BLUE CROSS/BLUE SHIELD | Admitting: Physical Therapy

## 2020-05-27 ENCOUNTER — Other Ambulatory Visit: Payer: Self-pay

## 2020-05-27 ENCOUNTER — Encounter: Payer: Self-pay | Admitting: Physical Therapy

## 2020-05-27 DIAGNOSIS — M25661 Stiffness of right knee, not elsewhere classified: Secondary | ICD-10-CM

## 2020-05-27 DIAGNOSIS — M25562 Pain in left knee: Secondary | ICD-10-CM | POA: Diagnosis not present

## 2020-05-27 DIAGNOSIS — M25561 Pain in right knee: Secondary | ICD-10-CM

## 2020-05-27 NOTE — Therapy (Signed)
[facility] [address] [place], [address] Phone: [phone]   Fax:  [phone]  Physical Therapy Treatment  Patient Details  Name: Amber Coleman MRN: [id number] Date of Birth: [birth year] No data recorded  Encounter Date: 05/27/2020   PT End of Session - 05/27/20 0912    Visit Number 15    Number of Visits 17    Date for PT Re-Evaluation 05/16/20    Authorization Type BCBS 30 02/17/20 - 02/15/21    Authorization Time Period 03/21/20-06/13/20    Authorization - Visit Number 15    Authorization - Number of Visits 30    PT Start Time 0900    PT Stop Time 0940    PT Time Calculation (min) 40 min    Equipment Utilized During Treatment Left knee immobilizer    Activity Tolerance Patient tolerated treatment well;No increased pain    Behavior During Therapy WFL for tasks assessed/performed           Past Medical History:  Diagnosis Date  . Anxiety   . GERD (gastroesophageal reflux disease)   . Hypertension    situational; no longer needs medication  . Menorrhagia   . Obstructive sleep apnea    uses CPAP    Past Surgical History:  Procedure Laterality Date  . BILATERAL SALPINGECTOMY Bilateral 12/24/2017   Procedure: BILATERAL SALPINGECTOMY;  Surgeon: Ward, Honor Loh, MD;  Location: [facility];  Service: Gynecology;  Laterality: Bilateral;  . BREAST ENHANCEMENT SURGERY Bilateral 05/2017  . BUNIONECTOMY Left 05/13/2016  . BUNIONECTOMY Right 12/2016  . LIPOSUCTION  06/2016  . VAGINAL HYSTERECTOMY N/A 12/24/2017   Procedure: HYSTERECTOMY VAGINAL;  Surgeon: Ward, Honor Loh, MD;  Location: [facility];  Service: Gynecology;  Laterality: N/A;  . VULVECTOMY PARTIAL  12/24/2017   Procedure: VULVECTOMY PARTIAL;  Surgeon: Ward, Honor Loh, MD;  Location: [facility];  Service: Gynecology;;  . wisdom teeth   1996    There were no vitals filed for this visit.   Subjective Assessment - 05/27/20 0908    Subjective  Pt reports last week she was able to walk 69miles in the park, and bike 96miles on pavement last week without pain. Reports no pain this am.    Pertinent History Pt is 45 y.o female s/p ACL reconstruction 03/18/20 autograft with L quad tendon. Pain currently 4/10 Worst 8/10 Best 3/10 on NPRS. Pt is able to WBAT per acl rehab protocol, with locked ext brace for 2 weeks (including sleep). Pain can be increased with prolonged walking/standing. Uses ice and medication to help reduce pain level. Pt currently works at home and still able to perform ADLs while wearing brace with modifications. Pt would like progress acl rehab at a respectable to pace to return to prior level of function with ultimate goal to get back to kickboxing.    Limitations Walking;Standing    How long can you sit comfortably? unlimited    How long can you stand comfortably? as tolerated    How long can you walk comfortably? as tolerate    Patient Stated Goals Progress ACL rehab at respectable pace and return to kickboxing    Pain Onset 1 to 4 weeks ago             INTERVENTION THIS DATE: -Recumbent bike 5 min ; seat 8 L2-3 for low load resistance  - Alt lateral lunge on foam 2x 12; with 10# DB x12 - Alt lunge stepping onto foam 3x  12 with min cuing for technique with good carry over -TRX SL squat with unilateral HH 3x 10 with min cuing for full knee ext, slight difficulty with this - Cable pull through's 20# 3x 8  -SL leg press 45lb 3x 07/26/08 with min cuing for quad activation with good carry over  -Hamstring stretch2x30secLLE (slight knee flexion to avoid recurvatum)  -Standing rectusstretchfootpropped on chair+airex+towel30sec holdboth legs                           PT Education - 05/27/20 0911    Education Details therex form/technique    Person(s) Educated Patient    Methods Explanation;Demonstration;Tactile cues;Verbal cues    Comprehension Verbalized understanding;Returned  demonstration;Verbal cues required            PT Short Term Goals - 03/21/20 1535      PT SHORT TERM GOAL #1   Title Pt will be independent with HEP in order to maintain current strength to further progress therex following rehab protocol.    Baseline 03/21/20    Time 4    Period Weeks    Status New             PT Long Term Goals - 03/21/20 1536      PT LONG TERM GOAL #1   Title Pt will decrease worst pain as reported on NPRS by at least 3 points in order to demonstrate clinically significant reduction in knee pain    Baseline 03/21/20 8/10    Time 8    Period Weeks    Status New      PT LONG TERM GOAL #2   Title Pt will increase AROM of the knee to WNL in order to complete functional ADLs, sqautting, and normalize gait and stair negotiation    Baseline 03/21/20 Flex 40 ext 4    Time 8    Period Weeks    Status New      PT LONG TERM GOAL #3   Title Pt will demonstrate a 1 RM leg press to 75# for the right knee to demonstrate increased strength in order to complete heavy household ADLs and community participation.    Baseline 03/21/20 unable to complete resistance measure    Time 8    Period Weeks    Status New      PT LONG TERM GOAL #4   Title Patient will demonstrate a 1 RM knee ext of 45# for the right knee in order demonstrate increased strength in order to participate in heavy household ADLs and community participation.    Baseline 05/08/19 unable to complete resistance measure    Time 8    Period Weeks    Status New      PT LONG TERM GOAL #5   Title Patient will increase FOTO score to 67 to demonstrate predicted increase in functional mobility to complete ADLs    Baseline 03/21/20 47    Time 8    Period Weeks    Status New                 Plan - 05/27/20 0926    Clinical Impression Statement PT continued POC by protocol advisement with success. Pt reports no increased pain, popping/clicking sensations throughout session. Patient is going on vacation to  [place] next week, PT advised MD clearance for questions concerning swimming. Patient is able to complete all therex progressions with proper technique following cuing with good motivation throughout session.  PT will continue progression as able, initiating week 12 protocol next week    Personal Factors and Comorbidities Comorbidity 1;Comorbidity 2;Time since onset of injury/illness/exacerbation    Comorbidities GERD, anxiety    Examination-Activity Limitations Lift;Locomotion Level;Carry;Squat;Stand;Sleep;Bathing    Examination-Participation Restrictions Community Activity;Cleaning    Stability/Clinical Decision Making Evolving/Moderate complexity    Clinical Decision Making Moderate    Rehab Potential Good    PT Frequency 2x / week    PT Duration 8 weeks    PT Treatment/Interventions ADLs/Self Care Home Management;Electrical Stimulation;Moist Heat;Stair training;Therapeutic exercise;Ultrasound;Traction;DME Instruction;Cryotherapy;Functional mobility training;Therapeutic activities;Neuromuscular re-education;Balance training;Patient/family education;Manual techniques;Dry needling;Passive range of motion;Spinal Manipulations;Joint Manipulations;Gait training    PT Next Visit Plan HEP review, progress therex per rehab protocol    PT Home Exercise Plan Squat with Chair Touch, Lateral Lunge, Supine Active Straight Leg Raise, Hip Abduction with Resistance Loop, Walking Forward Lunge    Consulted and Agree with Plan of Care Patient           Patient will benefit from skilled therapeutic intervention in order to improve the following deficits and impairments:  Abnormal gait,Decreased activity tolerance,Decreased endurance,Decreased balance,Decreased mobility,Difficulty walking,Impaired flexibility,Impaired tone,Postural dysfunction,Decreased coordination,Decreased range of motion,Decreased strength,Increased fascial restricitons,Improper body mechanics,Pain  Visit Diagnosis: Acute pain of left  knee  Stiffness of right knee, not elsewhere classified  Acute pain of right knee     Problem List Patient Active Problem List   Diagnosis Date Noted  . Menorrhagia 06/17/2016  . Awareness of heartbeats 03/01/2015  . Hypertension 01/01/2015  . Social phobia involving fear of public speaking 97/94/8016  . Acid reflux 08/25/2014  . 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 Apnea, sleep 08/25/2014  . Hypertrophic condition of skin 08/25/2014  . 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 [place], [address] Phone: [phone]   Fax:  [phone]  Name: Aoife Bold MRN: [id number] Date of Birth: [birth year]

## 2020-05-29 ENCOUNTER — Encounter: Payer: BLUE CROSS/BLUE SHIELD | Admitting: Physical Therapy

## 2020-06-03 ENCOUNTER — Encounter: Payer: BLUE CROSS/BLUE SHIELD | Admitting: Physical Therapy

## 2020-06-05 ENCOUNTER — Encounter: Payer: BLUE CROSS/BLUE SHIELD | Admitting: Physical Therapy

## 2020-06-11 ENCOUNTER — Other Ambulatory Visit: Payer: Self-pay

## 2020-06-11 ENCOUNTER — Encounter: Payer: Self-pay | Admitting: Physical Therapy

## 2020-06-11 ENCOUNTER — Ambulatory Visit: Payer: BLUE CROSS/BLUE SHIELD | Admitting: Physical Therapy

## 2020-06-11 DIAGNOSIS — M25661 Stiffness of right knee, not elsewhere classified: Secondary | ICD-10-CM

## 2020-06-11 DIAGNOSIS — M25562 Pain in left knee: Secondary | ICD-10-CM

## 2020-06-11 NOTE — Therapy (Signed)
[facility] [address], [place], [address] Phone: [phone]   Fax:  [phone]  Physical Therapy Treatment  Patient Details  Name: Amber Coleman MRN: [id number] Date of Birth: 1975-07-14 No data recorded  Encounter Date: 06/11/2020   PT End of Session - 06/11/20 1606    Visit Number 16    Number of Visits 17    Date for PT Re-Evaluation 05/16/20    Authorization Type [employer]BS 30 02/17/20 - 02/15/21    Authorization Time Period 03/21/20-06/13/20    Authorization - Visit Number 16    Authorization - Number of Visits 30    PT Start Time 3646    PT Stop Time 8032    PT Time Calculation (min) 41 min    Activity Tolerance Patient tolerated treatment well;No increased pain    Behavior During Therapy WFL for tasks assessed/performed           Past Medical History:  Diagnosis Date  . Anxiety   . GERD (gastroesophageal reflux disease)   . Hypertension    situational; no longer needs medication  . Menorrhagia   . Obstructive sleep apnea    uses CPAP    Past Surgical History:  Procedure Laterality Date  . BILATERAL SALPINGECTOMY Bilateral 12/24/2017   Procedure: BILATERAL SALPINGECTOMY;  Surgeon: Ward, Honor Loh, MD;  Location: [facility];  Service: Gynecology;  Laterality: Bilateral;  . BREAST ENHANCEMENT SURGERY Bilateral 05/2017  . BUNIONECTOMY Left 05/13/2016  . BUNIONECTOMY Right 12/2016  . LIPOSUCTION  06/2016  . VAGINAL HYSTERECTOMY N/A 12/24/2017   Procedure: HYSTERECTOMY VAGINAL;  Surgeon: Ward, Honor Loh, MD;  Location: [facility];  Service: Gynecology;  Laterality: N/A;  . VULVECTOMY PARTIAL  12/24/2017   Procedure: VULVECTOMY PARTIAL;  Surgeon: Ward, Honor Loh, MD;  Location: [facility];  Service: Gynecology;;  . wisdom teeth   1996    There were no vitals filed for this visit.   Subjective Assessment - 06/11/20 1518    Subjective Patient reports no pain but does feel stiffness in the morning  or when sitting for long periods of time. She did an 1.5 hour hike the other day with no increase in pain. Patient reports she was able to swim with no increase in pain.    Pertinent History Pt is 45 y.o female s/p ACL reconstruction 03/18/20 autograft with L quad tendon. Pain currently 4/10 Worst 8/10 Best 3/10 on NPRS. Pt is able to WBAT per acl rehab protocol, with locked ext brace for 2 weeks (including sleep). Pain can be increased with prolonged walking/standing. Uses ice and medication to help reduce pain level. Pt currently works at home and still able to perform ADLs while wearing brace with modifications. Pt would like progress acl rehab at a respectable to pace to return to prior level of function with ultimate goal to get back to kickboxing.    Limitations Walking;Standing    How long can you sit comfortably? unlimited    How long can you stand comfortably? as tolerated    How long can you walk comfortably? as tolerate    Diagnostic tests Xray and MRI    Patient Stated Goals Progress ACL rehab at respectable pace and return to kickboxing    Currently in Pain? No/denies          Therex: treadmill - 2 mph for 3 min, increased to jog at 3.9 mph with good form for 1 min, increased to 4.3 mph and  demonstrated decreased stance time on LLE but no lateral shifting of the knee, decreased to 2.6 mph to return to a walk then increased to 3.9 for 1 min   small jumping on trampoline with only heels leaving trampoline for 1 minute with no increased pain   Big hops on trampoline- 3 sets of 15  With no increased pain, cueing for soft landing    Squat to heel raise with 1 10# DB 1 set of 10, 2x 8 with 15# DB, cueing to control eccentric movement and increase heel raise to strengthen gastroch   side lunge on heel slider - 3 x 10, cueing to engage quad to control knee movement   Agility ladder: in/in and out/out going forwards - 4 times, moderate difficulty demonstrating pattern initially, but good  carryover after initial attempts   Agility ladder: lateral in/in and out/out repeated three times, good carryover after initial demonstration   Squat on black side of bosu ball 3 x12, cueing to keep ball flat and increase WB on RLE throughout motion   SL leg press 55lb 1x 6 with assistance to initiate movement, 2x6 with 45#with min cuing for quad activation with good carry over -Hamstring stretch30secLLE (slight knee flexion to avoid recurvatum)  -Standing rectusstretchfootpropped on chair 30 sec hold       PT Education - 06/11/20 1605    Education Details therex form, progressed HEP    Person(s) Educated Patient    Methods Explanation;Demonstration;Verbal cues    Comprehension Verbalized understanding;Returned demonstration;Verbal cues required            PT Short Term Goals - 03/21/20 1535      PT SHORT TERM GOAL #1   Title Pt will be independent with HEP in order to maintain current strength to further progress therex following rehab protocol.    Baseline 03/21/20    Time 4    Period Weeks    Status New             PT Long Term Goals - 03/21/20 1536      PT LONG TERM GOAL #1   Title Pt will decrease worst pain as reported on NPRS by at least 3 points in order to demonstrate clinically significant reduction in knee pain    Baseline 03/21/20 8/10    Time 8    Period Weeks    Status New      PT LONG TERM GOAL #2   Title Pt will increase AROM of the knee to WNL in order to complete functional ADLs, sqautting, and normalize gait and stair negotiation    Baseline 03/21/20 Flex 40 ext 4    Time 8    Period Weeks    Status New      PT LONG TERM GOAL #3   Title Pt will demonstrate a 1 RM leg press to 75# for the right knee to demonstrate increased strength in order to complete heavy household ADLs and community participation.    Baseline 03/21/20 unable to complete resistance measure    Time 8    Period Weeks    Status New      PT LONG TERM GOAL #4   Title  Patient will demonstrate a 1 RM knee ext of 45# for the right knee in order demonstrate increased strength in order to participate in heavy household ADLs and community participation.    Baseline 05/08/19 unable to complete resistance measure    Time 8    Period Weeks  Status New      PT LONG TERM GOAL #5   Title Patient will increase FOTO score to 67 to demonstrate predicted increase in functional mobility to complete ADLs    Baseline 03/21/20 47    Time 8    Period Weeks    Status New                 Plan - 06/11/20 1606    Clinical Impression Statement PT continued exercise progression accordig to protocol with success. Patient reports no increased pain throughout session and no popping/clicking with the exercises. Patient responded to all cues for therex form and demonstrated good technique with all exercises. Patient demonstrated slightly decreased knee extension and stance time of LLE while jogging at increased speeds. PT will continue to progress exercises to patient tolerance.    Personal Factors and Comorbidities Comorbidity 1;Comorbidity 2;Time since onset of injury/illness/exacerbation    Comorbidities GERD, anxiety    Examination-Activity Limitations Lift;Locomotion Level;Carry;Squat;Stand;Sleep;Bathing    Examination-Participation Restrictions Community Activity;Cleaning    Stability/Clinical Decision Making Evolving/Moderate complexity    Clinical Decision Making Moderate    Rehab Potential Good    PT Frequency 2x / week    PT Duration 8 weeks    PT Treatment/Interventions ADLs/Self Care Home Management;Electrical Stimulation;Moist Heat;Stair training;Therapeutic exercise;Ultrasound;Traction;DME Instruction;Cryotherapy;Functional mobility training;Therapeutic activities;Neuromuscular re-education;Balance training;Patient/family education;Manual techniques;Dry needling;Passive range of motion;Spinal Manipulations;Joint Manipulations;Gait training    PT Next Visit Plan  HEP review, progress therex per rehab protocol    PT Home Exercise Plan Squat with Chair Touch, Lateral Lunge, Supine Active Straight Leg Raise, Hip Abduction with Resistance Loop, Walking Forward Lunge    Consulted and Agree with Plan of Care Patient           Patient will benefit from skilled therapeutic intervention in order to improve the following deficits and impairments:  Abnormal gait,Decreased activity tolerance,Decreased endurance,Decreased balance,Decreased mobility,Difficulty walking,Impaired flexibility,Impaired tone,Postural dysfunction,Decreased coordination,Decreased range of motion,Decreased strength,Increased fascial restricitons,Improper body mechanics,Pain  Visit Diagnosis: Acute pain of left knee  Stiffness of right knee, not elsewhere classified     Problem List Patient Active Problem List   Diagnosis Date Noted  . Menorrhagia 06/17/2016  . Awareness of heartbeats 03/01/2015  . Hypertension 01/01/2015  . 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 Acid reflux 08/25/2014  . 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 Apnea, sleep 08/25/2014  . Hypertrophic condition of skin 08/25/2014  . 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, [place], [address] Phone: [phone]   Fax:  [phone]  Name: Consetta Cosner MRN: [id number] Date of Birth: Sep 08, 1975

## 2020-06-13 ENCOUNTER — Ambulatory Visit: Payer: BLUE CROSS/BLUE SHIELD | Admitting: Physical Therapy

## 2020-06-13 ENCOUNTER — Encounter: Payer: Self-pay | Admitting: Physical Therapy

## 2020-06-13 ENCOUNTER — Other Ambulatory Visit: Payer: Self-pay

## 2020-06-13 DIAGNOSIS — M25562 Pain in left knee: Secondary | ICD-10-CM

## 2020-06-13 NOTE — Therapy (Signed)
[facility] [address] [place], [address] Phone: [phone]   Fax:  [phone]  Physical Therapy Treatment  Patient Details  Name: Amber Coleman MRN: [id number] Date of Birth: Jun 23, 1975 No data recorded  Encounter Date: 06/13/2020   PT End of Session - 06/13/20 0945    Visit Number 17    Number of Visits 17    Date for PT Re-Evaluation 05/16/20    Authorization Type BCBS 30 02/17/20 - 02/15/21    Authorization Time Period 03/21/20-06/13/20    Authorization - Visit Number 17    Authorization - Number of Visits 30    PT Start Time 0903    PT Stop Time 0943    PT Time Calculation (min) 40 min    Activity Tolerance Patient tolerated treatment well;No increased pain    Behavior During Therapy WFL for tasks assessed/performed           Past Medical History:  Diagnosis Date  . Anxiety   . GERD (gastroesophageal reflux disease)   . Hypertension    situational; no longer needs medication  . Menorrhagia   . Obstructive sleep apnea    uses CPAP    Past Surgical History:  Procedure Laterality Date  . BILATERAL SALPINGECTOMY Bilateral 12/24/2017   Procedure: BILATERAL SALPINGECTOMY;  Surgeon: Ward, Honor Loh, MD;  Location: [facility];  Service: Gynecology;  Laterality: Bilateral;  . BREAST ENHANCEMENT SURGERY Bilateral 05/2017  . BUNIONECTOMY Left 05/13/2016  . BUNIONECTOMY Right 12/2016  . LIPOSUCTION  06/2016  . VAGINAL HYSTERECTOMY N/A 12/24/2017   Procedure: HYSTERECTOMY VAGINAL;  Surgeon: Ward, Honor Loh, MD;  Location: [facility];  Service: Gynecology;  Laterality: N/A;  . VULVECTOMY PARTIAL  12/24/2017   Procedure: VULVECTOMY PARTIAL;  Surgeon: Ward, Honor Loh, MD;  Location: [facility];  Service: Gynecology;;  . wisdom teeth   1996    There were no vitals filed for this visit.   Subjective Assessment - 06/13/20 0902    Subjective Patient report no except some soreness from the previous  session. Patient reports some stiffness this AM. She did a three mile walk yesterday with no increase in pain. Patient will work on new exercises over the weekend    Pertinent History Pt is 45 y.o female s/p ACL reconstruction 03/18/20 autograft with L quad tendon. Pain currently 4/10 Worst 8/10 Best 3/10 on NPRS. Pt is able to WBAT per acl rehab protocol, with locked ext brace for 2 weeks (including sleep). Pain can be increased with prolonged walking/standing. Uses ice and medication to help reduce pain level. Pt currently works at home and still able to perform ADLs while wearing brace with modifications. Pt would like progress acl rehab at a respectable to pace to return to prior level of function with ultimate goal to get back to kickboxing.    Limitations Walking;Standing    How long can you sit comfortably? unlimited    How long can you stand comfortably? as tolerated    How long can you walk comfortably? as tolerate    Diagnostic tests Xray and MRI    Patient Stated Goals Progress ACL rehab at respectable pace and return to kickboxing    Currently in Pain? No/denies           Therex: treadmill - 2 mph for 3 min, increased to jog at 4.3 mph with good form for 1 min, decreased to 2.5 mph for 2 min walk   side lunge  on heel slider - 1 x 12, with no cueing required and patient reported easy   side lunge/ reverse lunge combo with slider 2x7 - with cueing to control knee   Squat to heel raise with 3 x 8 with 15# DB, cueing to control eccentric movement and increase heel raise to strengthen gastroch   SLS on LLE on foam pad, tapping three cones - intermittent toe touch assistance on RLE-  3x20 cone taps, last set no toe touch assistance    Squat jumps on trampoline- 3 sets of 15  With no increased pain, cueing for soft landing throughout sets       Squat on black side of bosu ball 3 x12 with no UE assistance, cueing to keep ball flat and increase WB on LLE throughout motion    Single leg  squat to chair 3x8 with TRX - cueing to keep chest up and push through heel   -Hamstring stretch 30sec LLE (slight knee flexion to avoid recurvatum)  -Standing rectus stretch foot propped on chair 30 sec hold       PT Education - 06/13/20 0944    Education Details therex form/technique    Person(s) Educated Patient    Methods Explanation;Demonstration;Verbal cues    Comprehension Verbalized understanding;Returned demonstration;Verbal cues required            PT Short Term Goals - 03/21/20 1535      PT SHORT TERM GOAL #1   Title Pt will be independent with HEP in order to maintain current strength to further progress therex following rehab protocol.    Baseline 03/21/20    Time 4    Period Weeks    Status New             PT Long Term Goals - 03/21/20 1536      PT LONG TERM GOAL #1   Title Pt will decrease worst pain as reported on NPRS by at least 3 points in order to demonstrate clinically significant reduction in knee pain    Baseline 03/21/20 8/10    Time 8    Period Weeks    Status New      PT LONG TERM GOAL #2   Title Pt will increase AROM of the knee to WNL in order to complete functional ADLs, sqautting, and normalize gait and stair negotiation    Baseline 03/21/20 Flex 40 ext 4    Time 8    Period Weeks    Status New      PT LONG TERM GOAL #3   Title Pt will demonstrate a 1 RM leg press to 75# for the right knee to demonstrate increased strength in order to complete heavy household ADLs and community participation.    Baseline 03/21/20 unable to complete resistance measure    Time 8    Period Weeks    Status New      PT LONG TERM GOAL #4   Title Patient will demonstrate a 1 RM knee ext of 45# for the right knee in order demonstrate increased strength in order to participate in heavy household ADLs and community participation.    Baseline 05/08/19 unable to complete resistance measure    Time 8    Period Weeks    Status New      PT LONG TERM GOAL #5   Title  Patient will increase FOTO score to 67 to demonstrate predicted increase in functional mobility to complete ADLs    Baseline 03/21/20 47  Time 8    Period Weeks    Status New                 Plan - 06/13/20 0945    Clinical Impression Statement PT continued exercise progression with success. Patient reports no increased pain throughout session and tolerated all exercises with no popping/clicking. Patient responded to all cues for therex form and demonstrated good technique with all exercises. Patient continues to demonstrate safe running mechanics and increased proprioception on LLE. PT will continue to progress exercises to patient tolerance.    Personal Factors and Comorbidities Comorbidity 1;Comorbidity 2;Time since onset of injury/illness/exacerbation    Comorbidities GERD, anxiety    Examination-Activity Limitations Lift;Locomotion Level;Carry;Squat;Stand;Sleep;Bathing    Examination-Participation Restrictions Community Activity;Cleaning    Stability/Clinical Decision Making Evolving/Moderate complexity    Clinical Decision Making Moderate    Rehab Potential Good    PT Frequency 2x / week    PT Duration 8 weeks    PT Treatment/Interventions ADLs/Self Care Home Management;Electrical Stimulation;Moist Heat;Stair training;Therapeutic exercise;Ultrasound;Traction;DME Instruction;Cryotherapy;Functional mobility training;Therapeutic activities;Neuromuscular re-education;Balance training;Patient/family education;Manual techniques;Dry needling;Passive range of motion;Spinal Manipulations;Joint Manipulations;Gait training    PT Next Visit Plan HEP review, progress therex per rehab protocol    PT Home Exercise Plan Squat with Chair Touch, Lateral Lunge, Supine Active Straight Leg Raise, Hip Abduction with Resistance Loop, Walking Forward Lunge    Consulted and Agree with Plan of Care Patient           Patient will benefit from skilled therapeutic intervention in order to improve the  following deficits and impairments:  Abnormal gait,Decreased activity tolerance,Decreased endurance,Decreased balance,Decreased mobility,Difficulty walking,Impaired flexibility,Impaired tone,Postural dysfunction,Decreased coordination,Decreased range of motion,Decreased strength,Increased fascial restricitons,Improper body mechanics,Pain  Visit Diagnosis: Acute pain of left knee     Problem List Patient Active Problem List   Diagnosis Date Noted  . Menorrhagia 06/17/2016  . Awareness of heartbeats 03/01/2015  . Hypertension 01/01/2015  . 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 Acid reflux 08/25/2014  . 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 Apnea, sleep 08/25/2014  . Hypertrophic condition of skin 08/25/2014  . 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 [place], [address] Phone: [phone]   Fax:  [phone]  Name: Amber Coleman MRN: [id number] Date of Birth: 02-16-1976

## 2020-06-18 ENCOUNTER — Other Ambulatory Visit: Payer: Self-pay

## 2020-06-18 ENCOUNTER — Ambulatory Visit: Payer: BLUE CROSS/BLUE SHIELD | Attending: Orthopaedic Surgery | Admitting: Physical Therapy

## 2020-06-18 ENCOUNTER — Encounter: Payer: Self-pay | Admitting: Physical Therapy

## 2020-06-18 DIAGNOSIS — M25661 Stiffness of right knee, not elsewhere classified: Secondary | ICD-10-CM | POA: Diagnosis present

## 2020-06-18 DIAGNOSIS — M25562 Pain in left knee: Secondary | ICD-10-CM | POA: Insufficient documentation

## 2020-06-18 DIAGNOSIS — M25561 Pain in right knee: Secondary | ICD-10-CM | POA: Insufficient documentation

## 2020-06-18 NOTE — Therapy (Signed)
[facility] [address] [place], [address] Phone: [phone]   Fax:  [phone]  Physical Therapy Treatment  Patient Details  Name: Amber Coleman MRN: [id number] Date of Birth: 05-Oct-1975 No data recorded  Encounter Date: 06/18/2020   PT End of Session - 06/18/20 0947    Visit Number 18    Number of Visits 17    Date for PT Re-Evaluation 05/16/20    Authorization Type [employer] 30 02/17/20 - 02/15/21    Authorization Time Period 03/21/20-06/13/20    Authorization - Visit Number 18    Authorization - Number of Visits 30    PT Start Time 8841    PT Stop Time 0944    PT Time Calculation (min) 39 min    Activity Tolerance Patient tolerated treatment well;No increased pain    Behavior During Therapy WFL for tasks assessed/performed           Past Medical History:  Diagnosis Date  . Anxiety   . GERD (gastroesophageal reflux disease)   . Hypertension    situational; no longer needs medication  . Menorrhagia   . Obstructive sleep apnea    uses CPAP    Past Surgical History:  Procedure Laterality Date  . BILATERAL SALPINGECTOMY Bilateral 12/24/2017   Procedure: BILATERAL SALPINGECTOMY;  Surgeon: Ward, Honor Loh, MD;  Location: [facility];  Service: Gynecology;  Laterality: Bilateral;  . BREAST ENHANCEMENT SURGERY Bilateral 05/2017  . BUNIONECTOMY Left 05/13/2016  . BUNIONECTOMY Right 12/2016  . LIPOSUCTION  06/2016  . VAGINAL HYSTERECTOMY N/A 12/24/2017   Procedure: HYSTERECTOMY VAGINAL;  Surgeon: Ward, Honor Loh, MD;  Location: [facility];  Service: Gynecology;  Laterality: N/A;  . VULVECTOMY PARTIAL  12/24/2017   Procedure: VULVECTOMY PARTIAL;  Surgeon: Ward, Honor Loh, MD;  Location: [facility];  Service: Gynecology;;  . wisdom teeth   1996    There were no vitals filed for this visit.   Subjective Assessment - 06/18/20 0910    Subjective Patient reports stiffness today due a long bike ride yesterday  but reports no pain today. Patient reports compliance with HEP and no increase in symptoms. Patient reports mild soreness after last session but it did not last more than a day.    Pertinent History Pt is 45 y.o female s/p ACL reconstruction 03/18/20 autograft with L quad tendon. Pain currently 4/10 Worst 8/10 Best 3/10 on NPRS. Pt is able to WBAT per acl rehab protocol, with locked ext brace for 2 weeks (including sleep). Pain can be increased with prolonged walking/standing. Uses ice and medication to help reduce pain level. Pt currently works at home and still able to perform ADLs while wearing brace with modifications. Pt would like progress acl rehab at a respectable to pace to return to prior level of function with ultimate goal to get back to kickboxing.    Limitations Walking;Standing    How long can you sit comfortably? unlimited    How long can you stand comfortably? as tolerated    How long can you walk comfortably? as tolerate    Diagnostic tests Xray and MRI    Patient Stated Goals Progress ACL rehab at respectable pace and return to kickboxing    Currently in Pain? No/denies    Pain Score 0-No pain          Therex: treadmill - 3 mph for 3 min, increased to jog at 4.3 mph with good form for 1 min, decreased to  2.5 mph for 1.5 min walk, increased to jog at 4.5 mph with good form for 1 min   side lunge on heel slider - 3 x 12, with no cueing required and patient demonstrated good knee tracking   Squat to heel raise on black side of bosu ball with 3 x 10, cueing to control eccentric movement and increase heel raise to strengthen gastroch   Small hops for 30 seconds on floor with no increased pain  Squat jumps- 3 sets of 8 With no increased pain, cueing for soft landing throughout sets     Single leg squat to chair 3x8,7,6 with TRX - cueing to keep chest up and push through heel   SL dead lift on LLE on foam pad 3x10  - intermittent UE assistance  Cueing to keep spine in  neutral  OMEGA singe leg press 3x10,9,8 at 45# - cueing to avoid compensations with opposite extremity   -standing hamstring stretch- 30 seconds on LLE  -Standing rectusstretchfootpropped on chair 30 sec hold on LLE   PT Education - 06/18/20 0946    Education Details therex form/technique    Person(s) Educated Patient    Methods Explanation;Demonstration;Verbal cues    Comprehension Verbalized understanding;Returned demonstration;Verbal cues required            PT Short Term Goals - 03/21/20 1535      PT SHORT TERM GOAL #1   Title Pt will be independent with HEP in order to maintain current strength to further progress therex following rehab protocol.    Baseline 03/21/20    Time 4    Period Weeks    Status New             PT Long Term Goals - 03/21/20 1536      PT LONG TERM GOAL #1   Title Pt will decrease worst pain as reported on NPRS by at least 3 points in order to demonstrate clinically significant reduction in knee pain    Baseline 03/21/20 8/10    Time 8    Period Weeks    Status New      PT LONG TERM GOAL #2   Title Pt will increase AROM of the knee to WNL in order to complete functional ADLs, sqautting, and normalize gait and stair negotiation    Baseline 03/21/20 Flex 40 ext 4    Time 8    Period Weeks    Status New      PT LONG TERM GOAL #3   Title Pt will demonstrate a 1 RM leg press to 75# for the right knee to demonstrate increased strength in order to complete heavy household ADLs and community participation.    Baseline 03/21/20 unable to complete resistance measure    Time 8    Period Weeks    Status New      PT LONG TERM GOAL #4   Title Patient will demonstrate a 1 RM knee ext of 45# for the right knee in order demonstrate increased strength in order to participate in heavy household ADLs and community participation.    Baseline 05/08/19 unable to complete resistance measure    Time 8    Period Weeks    Status New      PT LONG TERM GOAL #5    Title Patient will increase FOTO score to 67 to demonstrate predicted increase in functional mobility to complete ADLs    Baseline 03/21/20 47    Time 8    Period Weeks  Status New                 Plan - 06/18/20 0786    Clinical Impression Statement PT continued therex progression with success. Patient tolerates all strengthening and plyometric exercises with no increase in pain throughout sessions. Patient responded to all cues for therex form and demonstrated good technique with all exercises. Patient continues to demonstrate safe running mechanics. Patient also continues to demonstrate increased proprioception on LLE. PT will continue to progress exercises to patient tolerance.    Personal Factors and Comorbidities Comorbidity 1;Comorbidity 2;Time since onset of injury/illness/exacerbation    Comorbidities GERD, anxiety    Examination-Activity Limitations Lift;Locomotion Level;Carry;Squat;Stand;Sleep;Bathing    Examination-Participation Restrictions Community Activity;Cleaning    Rehab Potential Good    PT Frequency 2x / week    PT Duration 8 weeks    PT Treatment/Interventions ADLs/Self Care Home Management;Electrical Stimulation;Moist Heat;Stair training;Therapeutic exercise;Ultrasound;Traction;DME Instruction;Cryotherapy;Functional mobility training;Therapeutic activities;Neuromuscular re-education;Balance training;Patient/family education;Manual techniques;Dry needling;Passive range of motion;Spinal Manipulations;Joint Manipulations;Gait training    PT Next Visit Plan HEP review, progress therex per rehab protocol    PT Home Exercise Plan Squat with Chair Touch, Lateral Lunge, Supine Active Straight Leg Raise, Hip Abduction with Resistance Loop, Walking Forward Lunge    Consulted and Agree with Plan of Care Patient           Patient will benefit from skilled therapeutic intervention in order to improve the following deficits and impairments:  Abnormal gait,Decreased  activity tolerance,Decreased endurance,Decreased balance,Decreased mobility,Difficulty walking,Impaired flexibility,Impaired tone,Postural dysfunction,Decreased coordination,Decreased range of motion,Decreased strength,Increased fascial restricitons,Improper body mechanics,Pain  Visit Diagnosis: Acute pain of left knee  Stiffness of right knee, not elsewhere classified     Problem List Patient Active Problem List   Diagnosis Date Noted  . Menorrhagia 06/17/2016  . Awareness of heartbeats 03/01/2015  . Hypertension 01/01/2015  . 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 Acid reflux 08/25/2014  . 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 Apnea, sleep 08/25/2014  . Hypertrophic condition of skin 08/25/2014  . 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 [place], [address] Phone: [phone]   Fax:  [phone]  Name: Amber Coleman MRN: [id number] Date of Birth: 07-16-1975

## 2020-06-20 ENCOUNTER — Ambulatory Visit: Payer: BLUE CROSS/BLUE SHIELD | Admitting: Physical Therapy

## 2020-06-21 ENCOUNTER — Ambulatory Visit: Payer: BLUE CROSS/BLUE SHIELD | Admitting: Physical Therapy

## 2020-06-21 ENCOUNTER — Other Ambulatory Visit: Payer: Self-pay

## 2020-06-21 ENCOUNTER — Encounter: Payer: Self-pay | Admitting: Physical Therapy

## 2020-06-21 DIAGNOSIS — M25562 Pain in left knee: Secondary | ICD-10-CM

## 2020-06-21 NOTE — Therapy (Signed)
[facility] [address] [place], [address] Phone: [phone]   Fax:  [phone]  Physical Therapy Treatment  Patient Details  Name: Amber Coleman MRN: [id number] Date of Birth: 1975-05-13 No data recorded  Encounter Date: 06/21/2020   PT End of Session - 06/21/20 0956    Visit Number 19    Number of Visits 17    Date for PT Re-Evaluation 05/16/20    Authorization Type [employer] 30 02/17/20 - 02/15/21    Authorization Time Period 03/21/20-06/13/20    Authorization - Visit Number 19    Authorization - Number of Visits 30    PT Start Time 0935    PT Stop Time 1015    PT Time Calculation (min) 40 min    Activity Tolerance Patient tolerated treatment well;No increased pain    Behavior During Therapy WFL for tasks assessed/performed           Past Medical History:  Diagnosis Date  . Anxiety   . GERD (gastroesophageal reflux disease)   . Hypertension    situational; no longer needs medication  . Menorrhagia   . Obstructive sleep apnea    uses CPAP    Past Surgical History:  Procedure Laterality Date  . BILATERAL SALPINGECTOMY Bilateral 12/24/2017   Procedure: BILATERAL SALPINGECTOMY;  Surgeon: Ward, Honor Loh, MD;  Location: [facility];  Service: Gynecology;  Laterality: Bilateral;  . BREAST ENHANCEMENT SURGERY Bilateral 05/2017  . BUNIONECTOMY Left 05/13/2016  . BUNIONECTOMY Right 12/2016  . LIPOSUCTION  06/2016  . VAGINAL HYSTERECTOMY N/A 12/24/2017   Procedure: HYSTERECTOMY VAGINAL;  Surgeon: Ward, Honor Loh, MD;  Location: [facility];  Service: Gynecology;  Laterality: N/A;  . VULVECTOMY PARTIAL  12/24/2017   Procedure: VULVECTOMY PARTIAL;  Surgeon: Ward, Honor Loh, MD;  Location: [facility];  Service: Gynecology;;  . wisdom teeth   1996    There were no vitals filed for this visit.   Subjective Assessment - 06/21/20 0944    Subjective Patient reports no pain just stiffness in her knee. Patient  reports compliance with HEP and no increased symptoms.    Pertinent History Pt is 45 y.o female s/p ACL reconstruction 03/18/20 autograft with L quad tendon. Pain currently 4/10 Worst 8/10 Best 3/10 on NPRS. Pt is able to WBAT per acl rehab protocol, with locked ext brace for 2 weeks (including sleep). Pain can be increased with prolonged walking/standing. Uses ice and medication to help reduce pain level. Pt currently works at home and still able to perform ADLs while wearing brace with modifications. Pt would like progress acl rehab at a respectable to pace to return to prior level of function with ultimate goal to get back to kickboxing.    Limitations Walking;Standing    How long can you sit comfortably? unlimited    How long can you stand comfortably? as tolerated    How long can you walk comfortably? as tolerate    Diagnostic tests Xray and MRI    Patient Stated Goals Progress ACL rehab at respectable pace and return to kickboxing    Currently in Pain? No/denies    Pain Score 0-No pain              Therex: treadmill - 3 mph for 3 min, increased to jog at 4.5 mph with good form for 1 min, decreased to 3.0 mph for 2 min walk, increased to jog at 5.0 mph with good form for 1 min  Curtsey lunge on heel slider - 1 x 12, 2x10 with 5# DB bilaterally-  with no cueing required and patient demonstrated good knee tracking   Squat to heel raise on black side of bosu ball with 1 x 10, cueing to control eccentric movement and increase heel raise to strengthen gastroch   Squat jumps on agility ladder - alternating wide and close feet squats - down forwards and back jumping backwards-  repeated three times, cueing to hold squat for 2-3 seconds   Single leg squat to chair 3x10 with TRX -cueing to push through heel, good carryover from previous session with improved form    SL dead lift on LLE on foam pad 2x12 with 2# KB  - no UE assistance -Cueing to keep spine in neutral   -seated  hamstring stretch- 30 seconds on LLE  -Standing rectusstretchholding foot- 30 sec holdon LLE    PT Education - 06/21/20 0951    Education Details therex form/technique    Person(s) Educated Patient    Methods Explanation;Demonstration;Verbal cues    Comprehension Verbalized understanding;Returned demonstration;Verbal cues required            PT Short Term Goals - 03/21/20 1535      PT SHORT TERM GOAL #1   Title Pt will be independent with HEP in order to maintain current strength to further progress therex following rehab protocol.    Baseline 03/21/20    Time 4    Period Weeks    Status New             PT Long Term Goals - 03/21/20 1536      PT LONG TERM GOAL #1   Title Pt will decrease worst pain as reported on NPRS by at least 3 points in order to demonstrate clinically significant reduction in knee pain    Baseline 03/21/20 8/10    Time 8    Period Weeks    Status New      PT LONG TERM GOAL #2   Title Pt will increase AROM of the knee to WNL in order to complete functional ADLs, sqautting, and normalize gait and stair negotiation    Baseline 03/21/20 Flex 40 ext 4    Time 8    Period Weeks    Status New      PT LONG TERM GOAL #3   Title Pt will demonstrate a 1 RM leg press to 75# for the right knee to demonstrate increased strength in order to complete heavy household ADLs and community participation.    Baseline 03/21/20 unable to complete resistance measure    Time 8    Period Weeks    Status New      PT LONG TERM GOAL #4   Title Patient will demonstrate a 1 RM knee ext of 45# for the right knee in order demonstrate increased strength in order to participate in heavy household ADLs and community participation.    Baseline 05/08/19 unable to complete resistance measure    Time 8    Period Weeks    Status New      PT LONG TERM GOAL #5   Title Patient will increase FOTO score to 67 to demonstrate predicted increase in functional mobility to complete ADLs     Baseline 03/21/20 47    Time 8    Period Weeks    Status New                 Plan - 06/21/20 1018  Clinical Impression Statement PT continued therex progression with success. Patient tolerates all strengthening and plyometric exercsies with no increase in pain throughout sessions. Patient responded to all cues to correct therex form and demonstrated good technique after initial demonstration. Patient is demonstrating safe running and jumping mechanics. Patient is demonstrating increased proprioception on LLE. Patient is motivated to continue improving and PT will continue to progress exercises to patient tolerance.    Personal Factors and Comorbidities Comorbidity 1;Comorbidity 2;Time since onset of injury/illness/exacerbation    Comorbidities GERD, anxiety    Examination-Activity Limitations Lift;Locomotion Level;Carry;Squat;Stand;Sleep;Bathing    Examination-Participation Restrictions Community Activity;Cleaning    Stability/Clinical Decision Making Evolving/Moderate complexity    Clinical Decision Making Moderate    Rehab Potential Good    PT Frequency 2x / week    PT Duration 8 weeks    PT Treatment/Interventions ADLs/Self Care Home Management;Electrical Stimulation;Moist Heat;Stair training;Therapeutic exercise;Ultrasound;Traction;DME Instruction;Cryotherapy;Functional mobility training;Therapeutic activities;Neuromuscular re-education;Balance training;Patient/family education;Manual techniques;Dry needling;Passive range of motion;Spinal Manipulations;Joint Manipulations;Gait training    PT Next Visit Plan HEP review, progress therex per rehab protocol    PT Home Exercise Plan Squat with Chair Touch, Lateral Lunge, Supine Active Straight Leg Raise, Hip Abduction with Resistance Loop, Walking Forward Lunge    Consulted and Agree with Plan of Care Patient           Patient will benefit from skilled therapeutic intervention in order to improve the following deficits and  impairments:  Abnormal gait,Decreased activity tolerance,Decreased endurance,Decreased balance,Decreased mobility,Difficulty walking,Impaired flexibility,Impaired tone,Postural dysfunction,Decreased coordination,Decreased range of motion,Decreased strength,Increased fascial restricitons,Improper body mechanics,Pain  Visit Diagnosis: Acute pain of left knee     Problem List Patient Active Problem List   Diagnosis Date Noted  . Menorrhagia 06/17/2016  . Awareness of heartbeats 03/01/2015  . Hypertension 01/01/2015  . Social phobia involving fear of public speaking 51/03/5850  . Acid reflux 08/25/2014  . 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 Apnea, sleep 08/25/2014  . Hypertrophic condition of skin 08/25/2014  . 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 [place], [address] Phone: [phone]   Fax:  [phone]  Name: Amber Coleman MRN: [id number] Date of Birth: 1975/12/20

## 2020-06-24 ENCOUNTER — Other Ambulatory Visit: Payer: Self-pay

## 2020-06-24 ENCOUNTER — Ambulatory Visit: Payer: BLUE CROSS/BLUE SHIELD | Admitting: Physical Therapy

## 2020-06-24 ENCOUNTER — Encounter: Payer: Self-pay | Admitting: Physical Therapy

## 2020-06-24 DIAGNOSIS — M25661 Stiffness of right knee, not elsewhere classified: Secondary | ICD-10-CM

## 2020-06-24 DIAGNOSIS — M25562 Pain in left knee: Secondary | ICD-10-CM | POA: Diagnosis not present

## 2020-06-24 DIAGNOSIS — M25561 Pain in right knee: Secondary | ICD-10-CM

## 2020-06-24 NOTE — Therapy (Signed)
[facility] [address], [place], [address] Phone: [phone]   Fax:  [phone]  Physical Therapy Treatment  Patient Details  Name: Amber Coleman MRN: [id number] Date of Birth: May 18, 1975 No data recorded  Encounter Date: 06/24/2020   PT End of Session - 06/24/20 1644    Visit Number 20    Number of Visits 37    Date for PT Re-Evaluation 09/06/20    Authorization Type [employer]BS 30 02/17/20 - 02/15/21    Authorization Time Period 03/21/20-06/13/20    Authorization - Visit Number 20    Authorization - Number of Visits 30    PT Start Time 1600    PT Stop Time 5625    PT Time Calculation (min) 45 min    Equipment Utilized During Treatment Left knee immobilizer    Activity Tolerance Patient tolerated treatment well;No increased pain    Behavior During Therapy WFL for tasks assessed/performed           Past Medical History:  Diagnosis Date  . Anxiety   . GERD (gastroesophageal reflux disease)   . Hypertension    situational; no longer needs medication  . Menorrhagia   . Obstructive sleep apnea    uses CPAP    Past Surgical History:  Procedure Laterality Date  . BILATERAL SALPINGECTOMY Bilateral 12/24/2017   Procedure: BILATERAL SALPINGECTOMY;  Surgeon: Ward, Honor Loh, MD;  Location: [facility];  Service: Gynecology;  Laterality: Bilateral;  . BREAST ENHANCEMENT SURGERY Bilateral 05/2017  . BUNIONECTOMY Left 05/13/2016  . BUNIONECTOMY Right 12/2016  . LIPOSUCTION  06/2016  . VAGINAL HYSTERECTOMY N/A 12/24/2017   Procedure: HYSTERECTOMY VAGINAL;  Surgeon: Ward, Honor Loh, MD;  Location: [facility];  Service: Gynecology;  Laterality: N/A;  . VULVECTOMY PARTIAL  12/24/2017   Procedure: VULVECTOMY PARTIAL;  Surgeon: Ward, Honor Loh, MD;  Location: [facility];  Service: Gynecology;;  . wisdom teeth   1996    There were no vitals filed for this visit.   Subjective Assessment - 06/24/20 1633    Subjective Pt  doing well overall. No pain compliance with HEP    Pertinent History Pt is 45 y.o female s/p ACL reconstruction 03/18/20 autograft with L quad tendon. Pain currently 4/10 Worst 8/10 Best 3/10 on NPRS. Pt is able to WBAT per acl rehab protocol, with locked ext brace for 2 weeks (including sleep). Pain can be increased with prolonged walking/standing. Uses ice and medication to help reduce pain level. Pt currently works at home and still able to perform ADLs while wearing brace with modifications. Pt would like progress acl rehab at a respectable to pace to return to prior level of function with ultimate goal to get back to kickboxing.    Limitations Walking;Standing    How long can you sit comfortably? unlimited    How long can you stand comfortably? as tolerated    How long can you walk comfortably? as tolerate    Diagnostic tests Xray and MRI    Patient Stated Goals Progress ACL rehab at respectable pace and return to kickboxing    Pain Onset 1 to 4 weeks ago    Pain Onset More than a month ago             Therex: treadmill -20mph for 3 min, increased to jog at 5 mph with good form for 2 min, decreased to 3.0 mph for40min walk, increased to jog at 5.0 mph with good form for 1  min  L SLS on foam reaching for cone in semi circle in front of patient called out at random x12 each hand  Squat jump 3x 6 with min cuing for eccentric landing with good carry over  Lateral jump 3x 12 (6 each LE) with min cuing for landing mechanics with good carry over  Squat to heel raiseon black side of bosu ballwith 3x 10, cueing to control eccentric movement and increase heel raise to strengthen gastroch  Alt toe taps6in step 30sec 8in step 30sec with good carry over   Leg press 45# x10 50# 2x 6  -seated hamstring stretch- 30 seconds on LLE -Standing rectusstretchholding foot- 30 sec holdon LLE                         PT Education - 06/24/20 1642    Education  Details therex form/technique    Person(s) Educated Patient    Methods Explanation;Demonstration;Verbal cues    Comprehension Verbalized understanding;Returned demonstration;Verbal cues required            PT Short Term Goals - 03/21/20 1535      PT SHORT TERM GOAL #1   Title Pt will be independent with HEP in order to maintain current strength to further progress therex following rehab protocol.    Baseline 03/21/20    Time 4    Period Weeks    Status New             PT Long Term Goals - 03/21/20 1536      PT LONG TERM GOAL #1   Title Pt will decrease worst pain as reported on NPRS by at least 3 points in order to demonstrate clinically significant reduction in knee pain    Baseline 03/21/20 8/10    Time 8    Period Weeks    Status New      PT LONG TERM GOAL #2   Title Pt will increase AROM of the knee to WNL in order to complete functional ADLs, sqautting, and normalize gait and stair negotiation    Baseline 03/21/20 Flex 40 ext 4    Time 8    Period Weeks    Status New      PT LONG TERM GOAL #3   Title Pt will demonstrate a 1 RM leg press to 75# for the right knee to demonstrate increased strength in order to complete heavy household ADLs and community participation.    Baseline 03/21/20 unable to complete resistance measure    Time 8    Period Weeks    Status New      PT LONG TERM GOAL #4   Title Patient will demonstrate a 1 RM knee ext of 45# for the right knee in order demonstrate increased strength in order to participate in heavy household ADLs and community participation.    Baseline 05/08/19 unable to complete resistance measure    Time 8    Period Weeks    Status New      PT LONG TERM GOAL #5   Title Patient will increase FOTO score to 67 to demonstrate predicted increase in functional mobility to complete ADLs    Baseline 03/21/20 47    Time 8    Period Weeks    Status New                 Plan - 06/24/20 1650    Clinical Impression Statement PT  continued therex progression for increased LLE  strength and power, with initiation of plyometric strengthening with success. Patien tis able to demonstrate excellent carry over of all cuing for proper technique with good motivation throughout session. PT will continue progression as able.    Personal Factors and Comorbidities Comorbidity 1;Comorbidity 2;Time since onset of injury/illness/exacerbation    Comorbidities GERD, anxiety    Examination-Activity Limitations Lift;Locomotion Level;Carry;Squat;Stand;Sleep;Bathing    Examination-Participation Restrictions Community Activity;Cleaning    Stability/Clinical Decision Making Evolving/Moderate complexity    Clinical Decision Making Moderate    Rehab Potential Good    PT Frequency 2x / week    PT Duration 8 weeks    PT Treatment/Interventions ADLs/Self Care Home Management;Electrical Stimulation;Moist Heat;Stair training;Therapeutic exercise;Ultrasound;Traction;DME Instruction;Cryotherapy;Functional mobility training;Therapeutic activities;Neuromuscular re-education;Balance training;Patient/family education;Manual techniques;Dry needling;Passive range of motion;Spinal Manipulations;Joint Manipulations;Gait training    PT Next Visit Plan HEP review, progress therex per rehab protocol    PT Home Exercise Plan Squat with Chair Touch, Lateral Lunge, Supine Active Straight Leg Raise, Hip Abduction with Resistance Loop, Walking Forward Lunge    Consulted and Agree with Plan of Care Patient           Patient will benefit from skilled therapeutic intervention in order to improve the following deficits and impairments:  Abnormal gait,Decreased activity tolerance,Decreased endurance,Decreased balance,Decreased mobility,Difficulty walking,Impaired flexibility,Impaired tone,Postural dysfunction,Decreased coordination,Decreased range of motion,Decreased strength,Increased fascial restricitons,Improper body mechanics,Pain  Visit Diagnosis: Acute pain of left  knee  Stiffness of right knee, not elsewhere classified  Acute pain of right knee     Problem List Patient Active Problem List   Diagnosis Date Noted  . Menorrhagia 06/17/2016  . Awareness of heartbeats 03/01/2015  . Hypertension 01/01/2015  . 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 Acid reflux 08/25/2014  . 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 Apnea, sleep 08/25/2014  . Hypertrophic condition of skin 08/25/2014  . 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 [place], [address] Phone: [phone]   Fax:  [phone]  Name: Amber Coleman MRN: [id number] Date of Birth: 09/03/1975

## 2020-06-25 ENCOUNTER — Ambulatory Visit: Payer: BLUE CROSS/BLUE SHIELD | Admitting: Physical Therapy

## 2020-06-27 ENCOUNTER — Other Ambulatory Visit: Payer: Self-pay

## 2020-06-27 ENCOUNTER — Ambulatory Visit: Payer: BLUE CROSS/BLUE SHIELD | Admitting: Physical Therapy

## 2020-06-27 ENCOUNTER — Encounter: Payer: Self-pay | Admitting: Physical Therapy

## 2020-06-27 DIAGNOSIS — M25562 Pain in left knee: Secondary | ICD-10-CM | POA: Diagnosis not present

## 2020-06-27 DIAGNOSIS — M25661 Stiffness of right knee, not elsewhere classified: Secondary | ICD-10-CM

## 2020-06-27 DIAGNOSIS — M25561 Pain in right knee: Secondary | ICD-10-CM

## 2020-06-27 NOTE — Therapy (Signed)
[facility] [address], [place], [address] Phone: [phone]   Fax:  [phone]  Physical Therapy Treatment  Patient Details  Name: Amber Coleman MRN: [id number] Date of Birth: 12-28-1975 No data recorded  Encounter Date: 06/27/2020   PT End of Session - 06/27/20 1534    Visit Number 21    Number of Visits 37    Date for PT Re-Evaluation 09/06/20    Authorization Type [employer] 30 02/17/20 - 02/15/21    Authorization Time Period 03/21/20-06/13/20    Authorization - Visit Number 21    Authorization - Number of Visits 30    PT Start Time 6269    PT Stop Time 1600    PT Time Calculation (min) 45 min    Equipment Utilized During Treatment Left knee immobilizer    Activity Tolerance Patient tolerated treatment well;No increased pain    Behavior During Therapy WFL for tasks assessed/performed           Past Medical History:  Diagnosis Date  . Anxiety   . GERD (gastroesophageal reflux disease)   . Hypertension    situational; no longer needs medication  . Menorrhagia   . Obstructive sleep apnea    uses CPAP    Past Surgical History:  Procedure Laterality Date  . BILATERAL SALPINGECTOMY Bilateral 12/24/2017   Procedure: BILATERAL SALPINGECTOMY;  Surgeon: Ward, Honor Loh, MD;  Location: [facility];  Service: Gynecology;  Laterality: Bilateral;  . BREAST ENHANCEMENT SURGERY Bilateral 05/2017  . BUNIONECTOMY Left 05/13/2016  . BUNIONECTOMY Right 12/2016  . LIPOSUCTION  06/2016  . VAGINAL HYSTERECTOMY N/A 12/24/2017   Procedure: HYSTERECTOMY VAGINAL;  Surgeon: Ward, Honor Loh, MD;  Location: [facility];  Service: Gynecology;  Laterality: N/A;  . VULVECTOMY PARTIAL  12/24/2017   Procedure: VULVECTOMY PARTIAL;  Surgeon: Ward, Honor Loh, MD;  Location: [facility];  Service: Gynecology;;  . wisdom teeth   1996    There were no vitals filed for this visit.   Subjective Assessment - 06/27/20 1517    Subjective  Pt doing well overall no pain. Biked here just over a mile felt good. Completing HEP.    Pertinent History Pt is 45 y.o female s/p ACL reconstruction 03/18/20 autograft with L quad tendon. Pain currently 4/10 Worst 8/10 Best 3/10 on NPRS. Pt is able to WBAT per acl rehab protocol, with locked ext brace for 2 weeks (including sleep). Pain can be increased with prolonged walking/standing. Uses ice and medication to help reduce pain level. Pt currently works at home and still able to perform ADLs while wearing brace with modifications. Pt would like progress acl rehab at a respectable to pace to return to prior level of function with ultimate goal to get back to kickboxing.    Limitations Walking;Standing    How long can you sit comfortably? unlimited    How long can you stand comfortably? as tolerated    How long can you walk comfortably? as tolerate    Diagnostic tests Xray and MRI    Patient Stated Goals Progress ACL rehab at respectable pace and return to kickboxing    Pain Onset 1 to 4 weeks ago    Pain Onset More than a month ago           Therex: treadmill -37mph for 3 min, increased to jog at 40mph with good form for 1 min, decreased to 3.40mph for35min walk, increased to runat6.62mph with good form for 1  min; walk 59min cool down  Lateral jump 3x 12 (6 each LE) with min cuing for landing mechanics with good carry over; 2-3sec hold in SLS   Forward step down for landing mechanics from 6in step x12 bilat with excellent carry over of mechanics without pain  L step down for landing mechanics from 3in step x12 with good carry over of previous exercise (2-3sec hold SLS)  90d squat jumps L>Center>R x12 jumps altogether; 180d 12 altogether excellent landing technique, no pain    L SL heel raise total gym L26 x10; with small push off 2x 6/8 with good carry over of cuing for technique  Leg press 50# x8 ; 55# 2x 8   -seatedhamstring stretch- 30 seconds on LLE -Standing  rectusstretchholding foot-30 sec holdon LLE              PT Education - 06/27/20 1520    Education Details therex form/technique    Person(s) Educated Patient    Methods Explanation;Demonstration;Verbal cues    Comprehension Verbalized understanding;Returned demonstration;Verbal cues required            PT Short Term Goals - 03/21/20 1535      PT SHORT TERM GOAL #1   Title Pt will be independent with HEP in order to maintain current strength to further progress therex following rehab protocol.    Baseline 03/21/20    Time 4    Period Weeks    Status New             PT Long Term Goals - 03/21/20 1536      PT LONG TERM GOAL #1   Title Pt will decrease worst pain as reported on NPRS by at least 3 points in order to demonstrate clinically significant reduction in knee pain    Baseline 03/21/20 8/10    Time 8    Period Weeks    Status New      PT LONG TERM GOAL #2   Title Pt will increase AROM of the knee to WNL in order to complete functional ADLs, sqautting, and normalize gait and stair negotiation    Baseline 03/21/20 Flex 40 ext 4    Time 8    Period Weeks    Status New      PT LONG TERM GOAL #3   Title Pt will demonstrate a 1 RM leg press to 75# for the right knee to demonstrate increased strength in order to complete heavy household ADLs and community participation.    Baseline 03/21/20 unable to complete resistance measure    Time 8    Period Weeks    Status New      PT LONG TERM GOAL #4   Title Patient will demonstrate a 1 RM knee ext of 45# for the right knee in order demonstrate increased strength in order to participate in heavy household ADLs and community participation.    Baseline 05/08/19 unable to complete resistance measure    Time 8    Period Weeks    Status New      PT LONG TERM GOAL #5   Title Patient will increase FOTO score to 67 to demonstrate predicted increase in functional mobility to complete ADLs    Baseline 03/21/20 47    Time 8     Period Weeks    Status New                 Plan - 06/27/20 1546    Clinical Impression Statement PT continued  therex progression for increased LLE strength and power with success. Patietn demonstrates excellent carry over of all cuing for proper technique of therex with good motivation throughout session.Patient with no pain throughout session, occassional localized pain to patellar tendon.  PT will continue progression as able.    Personal Factors and Comorbidities Comorbidity 1;Comorbidity 2;Time since onset of injury/illness/exacerbation    Comorbidities GERD, anxiety    Examination-Activity Limitations Lift;Locomotion Level;Carry;Squat;Stand;Sleep;Bathing    Examination-Participation Restrictions Community Activity;Cleaning    Stability/Clinical Decision Making Evolving/Moderate complexity    Clinical Decision Making Moderate    Rehab Potential Good    PT Frequency 2x / week    PT Duration 8 weeks    PT Treatment/Interventions ADLs/Self Care Home Management;Electrical Stimulation;Moist Heat;Stair training;Therapeutic exercise;Ultrasound;Traction;DME Instruction;Cryotherapy;Functional mobility training;Therapeutic activities;Neuromuscular re-education;Balance training;Patient/family education;Manual techniques;Dry needling;Passive range of motion;Spinal Manipulations;Joint Manipulations;Gait training    PT Next Visit Plan HEP review, progress therex per rehab protocol    PT Home Exercise Plan Squat with Chair Touch, Lateral Lunge, Supine Active Straight Leg Raise, Hip Abduction with Resistance Loop, Walking Forward Lunge    Consulted and Agree with Plan of Care Patient           Patient will benefit from skilled therapeutic intervention in order to improve the following deficits and impairments:  Abnormal gait,Decreased activity tolerance,Decreased endurance,Decreased balance,Decreased mobility,Difficulty walking,Impaired flexibility,Impaired tone,Postural dysfunction,Decreased  coordination,Decreased range of motion,Decreased strength,Increased fascial restricitons,Improper body mechanics,Pain  Visit Diagnosis: Acute pain of left knee  Stiffness of right knee, not elsewhere classified  Acute pain of right knee     Problem List Patient Active Problem List   Diagnosis Date Noted  . Menorrhagia 06/17/2016  . Awareness of heartbeats 03/01/2015  . Hypertension 01/01/2015  . 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 Acid reflux 08/25/2014  . 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 Apnea, sleep 08/25/2014  . Hypertrophic condition of skin 08/25/2014  . 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 [place], [address] Phone: [phone]   Fax:  [phone]  Name: Amber Coleman MRN: [id number] Date of Birth: 03-16-1975

## 2020-07-01 ENCOUNTER — Other Ambulatory Visit: Payer: Self-pay

## 2020-07-01 ENCOUNTER — Ambulatory Visit: Payer: BLUE CROSS/BLUE SHIELD | Admitting: Physical Therapy

## 2020-07-01 ENCOUNTER — Encounter: Payer: Self-pay | Admitting: Physical Therapy

## 2020-07-01 DIAGNOSIS — M25562 Pain in left knee: Secondary | ICD-10-CM | POA: Diagnosis not present

## 2020-07-01 NOTE — Therapy (Signed)
[facility] [address] [place], [address] Phone: [phone]   Fax:  [phone]  Physical Therapy Treatment  Patient Details  Name: Amber Coleman MRN: [id number] Date of Birth: 05-Jun-1975 No data recorded  Encounter Date: 07/01/2020    Past Medical History:  Diagnosis Date  . Anxiety   . GERD (gastroesophageal reflux disease)   . Hypertension    situational; no longer needs medication  . Menorrhagia   . Obstructive sleep apnea    uses CPAP    Past Surgical History:  Procedure Laterality Date  . BILATERAL SALPINGECTOMY Bilateral 12/24/2017   Procedure: BILATERAL SALPINGECTOMY;  Surgeon: Ward, Honor Loh, MD;  Location: [facility];  Service: Gynecology;  Laterality: Bilateral;  . BREAST ENHANCEMENT SURGERY Bilateral 05/2017  . BUNIONECTOMY Left 05/13/2016  . BUNIONECTOMY Right 12/2016  . LIPOSUCTION  06/2016  . VAGINAL HYSTERECTOMY N/A 12/24/2017   Procedure: HYSTERECTOMY VAGINAL;  Surgeon: Ward, Honor Loh, MD;  Location: [facility];  Service: Gynecology;  Laterality: N/A;  . VULVECTOMY PARTIAL  12/24/2017   Procedure: VULVECTOMY PARTIAL;  Surgeon: Ward, Honor Loh, MD;  Location: [facility];  Service: Gynecology;;  . wisdom teeth   1996    There were no vitals filed for this visit.   Subjective Assessment - 07/01/20 1327    Subjective No pain on arrival, just completed 3.5 mile walk before session. Completing HEP. No pain to note. Follow up with surgeon scheduled tomorrow.    Pertinent History Pt is 45 y.o female s/p ACL reconstruction 03/18/20 autograft with L quad tendon. Pain currently 4/10 Worst 8/10 Best 3/10 on NPRS. Pt is able to WBAT per acl rehab protocol, with locked ext brace for 2 weeks (including sleep). Pain can be increased with prolonged walking/standing. Uses ice and medication to help reduce pain level. Pt currently works at home and still able to perform ADLs while wearing brace with  modifications. Pt would like progress acl rehab at a respectable to pace to return to prior level of function with ultimate goal to get back to kickboxing.    Limitations Walking;Standing    How long can you sit comfortably? unlimited    How long can you stand comfortably? as tolerated    How long can you walk comfortably? as tolerate    Diagnostic tests Xray and MRI    Patient Stated Goals Progress ACL rehab at respectable pace and return to kickboxing    Pain Onset 1 to 4 weeks ago    Pain Onset More than a month ago           Therex: treadmill -59mph for 3 min, increased to jog at 56mph with good form for39min, decreased to 3.6mph for52min walk,   39ft L SLS hopping 6 hops, 4 hops 3.5 hops to line   180d squat jumps 2x 12 altogether excellent landing technique, no pain    Hamstring curl in bridge on slider 3x 10 with small visible difference in hamstring girth  Cable pull throughs 20# 3x 10 with excellent carry over of demo  [place] split squat x10 with excellent carry over of techine  Side stepping in squat with GTB above knees x10 each direction   Visual review of SL deadlift with weights for HEP updates  -seatedhamstring stretch- 30 seconds on LLE -Standing rectusstretchholding foot-30 sec holdon LLE  HEP Update: Access Code: [email or web address] Supine Hamstring Curl on Swiss Ball - 1 x daily - 7 x weekly - 3  sets - 10 reps Forward T with Weight - 1 x daily - 7 x weekly - 3 sets - 10 reps Single Leg Lunge with Foot on Bench - 1 x daily - 7 x weekly - 3 sets - 6-10 reps Side Stepping with Resistance at Thighs - 1 x daily - 7 x weekly - 3 sets - 10 reps                             PT Short Term Goals - 03/21/20 1535      PT SHORT TERM GOAL #1   Title Pt will be independent with HEP in order to maintain current strength to further progress therex following rehab protocol.    Baseline 03/21/20    Time 4    Period Weeks    Status New              PT Long Term Goals - 03/21/20 1536      PT LONG TERM GOAL #1   Title Pt will decrease worst pain as reported on NPRS by at least 3 points in order to demonstrate clinically significant reduction in knee pain    Baseline 03/21/20 8/10    Time 8    Period Weeks    Status New      PT LONG TERM GOAL #2   Title Pt will increase AROM of the knee to WNL in order to complete functional ADLs, sqautting, and normalize gait and stair negotiation    Baseline 03/21/20 Flex 40 ext 4    Time 8    Period Weeks    Status New      PT LONG TERM GOAL #3   Title Pt will demonstrate a 1 RM leg press to 75# for the right knee to demonstrate increased strength in order to complete heavy household ADLs and community participation.    Baseline 03/21/20 unable to complete resistance measure    Time 8    Period Weeks    Status New      PT LONG TERM GOAL #4   Title Patient will demonstrate a 1 RM knee ext of 45# for the right knee in order demonstrate increased strength in order to participate in heavy household ADLs and community participation.    Baseline 05/08/19 unable to complete resistance measure    Time 8    Period Weeks    Status New      PT LONG TERM GOAL #5   Title Patient will increase FOTO score to 67 to demonstrate predicted increase in functional mobility to complete ADLs    Baseline 03/21/20 47    Time 8    Period Weeks    Status New                 Plan - 07/01/20 1422    Clinical Impression Statement PT continued therex progression for increased LLE strength, and proprioception with success. PT updated HEP to reflect increased strength exhibited by patient and continuing progress toward normal proprioception, which patient is able to demonstrate and verbalize understanding of. Pt inquiring about return to sport, protocol does not address this until 6 months post op, but patient plans on asking MD about this at follow up tomorrow. PT asked patient to inquire with MD about  "strict no open chain until 5 months" listed in her protocol, on if we can begin machine resisted open chain to work toward open chain strength and  power needed for kickboxing. PT will continue progression as able.    Personal Factors and Comorbidities Comorbidity 1;Comorbidity 2;Time since onset of injury/illness/exacerbation    Comorbidities GERD, anxiety    Examination-Activity Limitations Lift;Locomotion Level;Carry;Squat;Stand;Sleep;Bathing    Examination-Participation Restrictions Community Activity;Cleaning    Stability/Clinical Decision Making Evolving/Moderate complexity    Clinical Decision Making Moderate    Rehab Potential Good    PT Frequency 2x / week    PT Duration 8 weeks    PT Treatment/Interventions ADLs/Self Care Home Management;Electrical Stimulation;Moist Heat;Stair training;Therapeutic exercise;Ultrasound;Traction;DME Instruction;Cryotherapy;Functional mobility training;Therapeutic activities;Neuromuscular re-education;Balance training;Patient/family education;Manual techniques;Dry needling;Passive range of motion;Spinal Manipulations;Joint Manipulations;Gait training    PT Next Visit Plan HEP review, progress therex per rehab protocol    PT Home Exercise Plan Squat with Chair Touch, Lateral Lunge, Supine Active Straight Leg Raise, Hip Abduction with Resistance Loop, Walking Forward Lunge    Consulted and Agree with Plan of Care Patient           Patient will benefit from skilled therapeutic intervention in order to improve the following deficits and impairments:  Abnormal gait,Decreased activity tolerance,Decreased endurance,Decreased balance,Decreased mobility,Difficulty walking,Impaired flexibility,Impaired tone,Postural dysfunction,Decreased coordination,Decreased range of motion,Decreased strength,Increased fascial restricitons,Improper body mechanics,Pain  Visit Diagnosis: Acute pain of left knee     Problem List Patient Active Problem List   Diagnosis Date  Noted  . Menorrhagia 06/17/2016  . Awareness of heartbeats 03/01/2015  . Hypertension 01/01/2015  . 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 Acid reflux 08/25/2014  . 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 Apnea, sleep 08/25/2014  . Hypertrophic condition of skin 08/25/2014  . 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 [place], [address] Phone: [phone]   Fax:  [phone]  Name: Amber Coleman MRN: [id number] Date of Birth: 07/24/1975

## 2020-07-02 ENCOUNTER — Ambulatory Visit: Payer: BLUE CROSS/BLUE SHIELD | Admitting: Physical Therapy

## 2020-07-04 ENCOUNTER — Encounter: Payer: Self-pay | Admitting: Physical Therapy

## 2020-07-04 ENCOUNTER — Other Ambulatory Visit: Payer: Self-pay

## 2020-07-04 ENCOUNTER — Ambulatory Visit: Payer: BLUE CROSS/BLUE SHIELD | Admitting: Physical Therapy

## 2020-07-04 DIAGNOSIS — M25661 Stiffness of right knee, not elsewhere classified: Secondary | ICD-10-CM

## 2020-07-04 DIAGNOSIS — M25561 Pain in right knee: Secondary | ICD-10-CM

## 2020-07-04 DIAGNOSIS — M25562 Pain in left knee: Secondary | ICD-10-CM | POA: Diagnosis not present

## 2020-07-04 NOTE — Therapy (Signed)
[facility] [address], [place], [address] Phone: [phone]   Fax:  [phone]  Physical Therapy Treatment  Patient Details  Name: Amber Coleman MRN: [id number] Date of Birth: 02/19/1975 No data recorded  Encounter Date: 07/04/2020   PT End of Session - 07/04/20 1011    Visit Number 23    Number of Visits 37    Date for PT Re-Evaluation 09/06/20    Authorization - Visit Number 23    Authorization - Number of Visits 30    PT Start Time 0950    PT Stop Time 1030    PT Time Calculation (min) 40 min    Equipment Utilized During Treatment Left knee immobilizer    Activity Tolerance Patient tolerated treatment well;No increased pain    Behavior During Therapy WFL for tasks assessed/performed           Past Medical History:  Diagnosis Date  . Anxiety   . GERD (gastroesophageal reflux disease)   . Hypertension    situational; no longer needs medication  . Menorrhagia   . Obstructive sleep apnea    uses CPAP    Past Surgical History:  Procedure Laterality Date  . BILATERAL SALPINGECTOMY Bilateral 12/24/2017   Procedure: BILATERAL SALPINGECTOMY;  Surgeon: Ward, Honor Loh, MD;  Location: [facility];  Service: Gynecology;  Laterality: Bilateral;  . BREAST ENHANCEMENT SURGERY Bilateral 05/2017  . BUNIONECTOMY Left 05/13/2016  . BUNIONECTOMY Right 12/2016  . LIPOSUCTION  06/2016  . VAGINAL HYSTERECTOMY N/A 12/24/2017   Procedure: HYSTERECTOMY VAGINAL;  Surgeon: Ward, Honor Loh, MD;  Location: [facility];  Service: Gynecology;  Laterality: N/A;  . VULVECTOMY PARTIAL  12/24/2017   Procedure: VULVECTOMY PARTIAL;  Surgeon: Ward, Honor Loh, MD;  Location: [facility];  Service: Gynecology;;  . wisdom teeth   1996    There were no vitals filed for this visit.   Subjective Assessment - 07/04/20 0955    Subjective Patient reports no pain on arrival. She reports she saw her surgeon yesterday who advised her  to to not jump yet, and gave her an updated protocol. Otherwise reported she got a good report from Psychologist, sport and exercise. Reports she is not supposed to be running per MD, but so far has completed jogging with support in PT.    Pertinent History Pt is 45 y.o female s/p ACL reconstruction 03/18/20 autograft with L quad tendon. Pain currently 4/10 Worst 8/10 Best 3/10 on NPRS. Pt is able to WBAT per acl rehab protocol, with locked ext brace for 2 weeks (including sleep). Pain can be increased with prolonged walking/standing. Uses ice and medication to help reduce pain level. Pt currently works at home and still able to perform ADLs while wearing brace with modifications. Pt would like progress acl rehab at a respectable to pace to return to prior level of function with ultimate goal to get back to kickboxing.    Limitations Walking;Standing    How long can you sit comfortably? unlimited    How long can you stand comfortably? as tolerated    How long can you walk comfortably? as tolerate    Diagnostic tests Xray and MRI    Patient Stated Goals Progress ACL rehab at respectable pace and return to kickboxing    Pain Onset 1 to 4 weeks ago    Pain Onset More than a month ago           Therex: treadmill -79mph for 3 min, increased  to jog at 15mph with good form for66min, decreased to 3.1mph for45min walk,   Hamstring curl in bridge on slider 3x 10 with small visible difference in hamstring girth  Single leg hip hinge on foam with 10# 3x 10 with good carry over of demo   Cable pull throughs 25# x10  with excellent carry over of demo  [place] split squat 2x 10 with min cuing for technique with good carry over  Hamstring and quad stretch 30sec each                            PT Education - 07/04/20 1006    Education Details therex form/technique    Person(s) Educated Patient    Methods Explanation;Demonstration;Verbal cues    Comprehension Verbalized understanding;Returned  demonstration;Verbal cues required            PT Short Term Goals - 03/21/20 1535      PT SHORT TERM GOAL #1   Title Pt will be independent with HEP in order to maintain current strength to further progress therex following rehab protocol.    Baseline 03/21/20    Time 4    Period Weeks    Status New             PT Long Term Goals - 03/21/20 1536      PT LONG TERM GOAL #1   Title Pt will decrease worst pain as reported on NPRS by at least 3 points in order to demonstrate clinically significant reduction in knee pain    Baseline 03/21/20 8/10    Time 8    Period Weeks    Status New      PT LONG TERM GOAL #2   Title Pt will increase AROM of the knee to WNL in order to complete functional ADLs, sqautting, and normalize gait and stair negotiation    Baseline 03/21/20 Flex 40 ext 4    Time 8    Period Weeks    Status New      PT LONG TERM GOAL #3   Title Pt will demonstrate a 1 RM leg press to 75# for the right knee to demonstrate increased strength in order to complete heavy household ADLs and community participation.    Baseline 03/21/20 unable to complete resistance measure    Time 8    Period Weeks    Status New      PT LONG TERM GOAL #4   Title Patient will demonstrate a 1 RM knee ext of 45# for the right knee in order demonstrate increased strength in order to participate in heavy household ADLs and community participation.    Baseline 05/08/19 unable to complete resistance measure    Time 8    Period Weeks    Status New      PT LONG TERM GOAL #5   Title Patient will increase FOTO score to 67 to demonstrate predicted increase in functional mobility to complete ADLs    Baseline 03/21/20 47    Time 8    Period Weeks    Status New                 Plan - 07/04/20 1023    Clinical Impression Statement PT continued therex progression for increased LLE strength, forgoing any hopping/landing exercises per MD. Patient is able to complete all therex with proper technique  with expected fatigue. Pt to bring MD protocol next session. PT reduced frequency to  1x every other week to conserve authorized visits, as MD advised return to sport (kickboxing) until 8 months. PT will continue progression as able.    Personal Factors and Comorbidities Comorbidity 1;Comorbidity 2;Time since onset of injury/illness/exacerbation    Comorbidities GERD, anxiety    Examination-Activity Limitations Lift;Locomotion Level;Carry;Squat;Stand;Sleep;Bathing    Examination-Participation Restrictions Community Activity;Cleaning    Stability/Clinical Decision Making Evolving/Moderate complexity    Clinical Decision Making Moderate    Rehab Potential Good    PT Frequency 2x / week    PT Duration 8 weeks    PT Treatment/Interventions ADLs/Self Care Home Management;Electrical Stimulation;Moist Heat;Stair training;Therapeutic exercise;Ultrasound;Traction;DME Instruction;Cryotherapy;Functional mobility training;Therapeutic activities;Neuromuscular re-education;Balance training;Patient/family education;Manual techniques;Dry needling;Passive range of motion;Spinal Manipulations;Joint Manipulations;Gait training    PT Next Visit Plan HEP review, progress therex per rehab protocol    PT Home Exercise Plan Squat with Chair Touch, Lateral Lunge, Supine Active Straight Leg Raise, Hip Abduction with Resistance Loop, Walking Forward Lunge    Consulted and Agree with Plan of Care Patient           Patient will benefit from skilled therapeutic intervention in order to improve the following deficits and impairments:  Abnormal gait,Decreased activity tolerance,Decreased endurance,Decreased balance,Decreased mobility,Difficulty walking,Impaired flexibility,Impaired tone,Postural dysfunction,Decreased coordination,Decreased range of motion,Decreased strength,Increased fascial restricitons,Improper body mechanics,Pain  Visit Diagnosis: Acute pain of left knee  Stiffness of right knee, not elsewhere  classified  Acute pain of right knee     Problem List Patient Active Problem List   Diagnosis Date Noted  . Menorrhagia 06/17/2016  . Awareness of heartbeats 03/01/2015  . Hypertension 01/01/2015  . Social phobia involving fear of public speaking 26/71/2458  . Acid reflux 08/25/2014  . 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 Apnea, sleep 08/25/2014  . Hypertrophic condition of skin 08/25/2014  . 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, [place], [address] Phone: [phone]   Fax:  [phone]  Name: Amber Coleman MRN: [id number] Date of Birth: [birth year]

## 2020-07-24 ENCOUNTER — Ambulatory Visit: Payer: BLUE CROSS/BLUE SHIELD | Attending: Orthopaedic Surgery | Admitting: Physical Therapy

## 2020-07-24 ENCOUNTER — Encounter: Payer: Self-pay | Admitting: Physical Therapy

## 2020-07-24 ENCOUNTER — Other Ambulatory Visit: Payer: Self-pay

## 2020-07-24 DIAGNOSIS — M25561 Pain in right knee: Secondary | ICD-10-CM | POA: Diagnosis present

## 2020-07-24 DIAGNOSIS — M25562 Pain in left knee: Secondary | ICD-10-CM | POA: Diagnosis not present

## 2020-07-24 DIAGNOSIS — M25661 Stiffness of right knee, not elsewhere classified: Secondary | ICD-10-CM | POA: Diagnosis present
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
## 2020-08-14 ENCOUNTER — Encounter: Payer: Self-pay | Admitting: Physical Therapy

## 2020-08-14 ENCOUNTER — Ambulatory Visit: Payer: BLUE CROSS/BLUE SHIELD | Admitting: Physical Therapy

## 2020-08-14 DIAGNOSIS — M25562 Pain in left knee: Secondary | ICD-10-CM

## 2020-08-14 DIAGNOSIS — M25661 Stiffness of right knee, not elsewhere classified: Secondary | ICD-10-CM

## 2020-08-14 DIAGNOSIS — M25561 Pain in right knee: Secondary | ICD-10-CM

## 2020-08-14 NOTE — Therapy (Signed)
[facility] [address] [place], [address] Phone: [phone]   Fax:  [phone]  Physical Therapy Treatment  Patient Details  Name: Amber Coleman MRN: [id number] Date of Birth: 1975-07-10 No data recorded  Encounter Date: 08/14/2020   PT End of Session - 08/14/20 0918     Visit Number 25    Number of Visits 37    Date for PT Re-Evaluation 09/06/20    Authorization Type BCBS 30 02/17/20 - 02/15/21    Authorization Time Period 03/21/20-06/13/20    Authorization - Visit Number 25    Authorization - Number of Visits 30    PT Start Time 0907    PT Stop Time 0945    PT Time Calculation (min) 38 min    Activity Tolerance Patient tolerated treatment well;No increased pain    Behavior During Therapy WFL for tasks assessed/performed             Past Medical History:  Diagnosis Date   Anxiety    GERD (gastroesophageal reflux disease)    Hypertension    situational; no longer needs medication   Menorrhagia    Obstructive sleep apnea    uses CPAP    Past Surgical History:  Procedure Laterality Date   BILATERAL SALPINGECTOMY Bilateral 12/24/2017   Procedure: BILATERAL SALPINGECTOMY;  Surgeon: Ward, Honor Loh, MD;  Location: [facility];  Service: Gynecology;  Laterality: Bilateral;   BREAST ENHANCEMENT SURGERY Bilateral 05/2017   BUNIONECTOMY Left 05/13/2016   BUNIONECTOMY Right 12/2016   LIPOSUCTION  06/2016   VAGINAL HYSTERECTOMY N/A 12/24/2017   Procedure: HYSTERECTOMY VAGINAL;  Surgeon: Maceo Pro, MD;  Location: [facility];  Service: Gynecology;  Laterality: N/A;   VULVECTOMY PARTIAL  12/24/2017   Procedure: VULVECTOMY PARTIAL;  Surgeon: Ward, Honor Loh, MD;  Location: [facility];  Service: Gynecology;;   wisdom teeth   1996    There were no vitals filed for this visit.   Subjective Assessment - 08/14/20 0910     Subjective Pt reports she was at the beach last week and forgot about her knee  and has been jumping in the water without pain. She has been walking, swimming, biking, without pain. Completing HEP without question or concern.    Pertinent History Pt is 45 y.o female s/p ACL reconstruction 03/18/20 autograft with L quad tendon. Pain currently 4/10 Worst 8/10 Best 3/10 on NPRS. Pt is able to WBAT per acl rehab protocol, with locked ext brace for 2 weeks (including sleep). Pain can be increased with prolonged walking/standing. Uses ice and medication to help reduce pain level. Pt currently works at home and still able to perform ADLs while wearing brace with modifications. Pt would like progress acl rehab at a respectable to pace to return to prior level of function with ultimate goal to get back to kickboxing.    Limitations Walking;Standing    How long can you sit comfortably? unlimited    How long can you stand comfortably? as tolerated    How long can you walk comfortably? as tolerate    Diagnostic tests Xray and MRI    Patient Stated Goals Progress ACL rehab at respectable pace and return to kickboxing    Pain Onset 1 to 4 weeks ago    Pain Onset More than a month ago               Therex: treadmill - 3 mph for 2 min, increased to jog  at 4.5>5 mph with good form for 3 min, decreased to 3.0 mph for 75min walk With running difficulty with full knee ext for LLE contact  [place] split squat with 5# DB x10 with good carry over of original technique  SL deadlift 5# DB in each hand with cuing to prevent hip rotation with good carry over  SL squat to chair with 5# DB in each hand x6 cuing for prevent knee valgus, excellent carry over, education on decreasing reps with increased wt with good understanding   SL squat with hop on total gym L22 3x 6  with cuing for full knee ext with good carry over  Prone alt kicks to red tball 2x 30sec; noted decreased flex of LLE Standing buttkick attempt 50% range on LLE  Not billed Dry Needling: (2) 87mm .25 needles placed along the  L vastus medius to decrease increased muscular spasms and trigger points with the patient positioned in supine. Patient was educated on risks and benefits of therapy and verbally consents to PT.  Prior elys foot 6in from bottom; following heel touch to bottom (symmetrical to RLE)    Quad and hamstring stretch 11min hold                              PT Education - 08/14/20 0917     Education Details therex form/technique    Person(s) Educated Patient    Methods Explanation;Demonstration;Verbal cues    Comprehension Verbalized understanding;Returned demonstration;Verbal cues required              PT Short Term Goals - 03/21/20 1535       PT SHORT TERM GOAL #1   Title Pt will be independent with HEP in order to maintain current strength to further progress therex following rehab protocol.    Baseline 03/21/20    Time 4    Period Weeks    Status New               PT Long Term Goals - 03/21/20 1536       PT LONG TERM GOAL #1   Title Pt will decrease worst pain as reported on NPRS by at least 3 points in order to demonstrate clinically significant reduction in knee pain    Baseline 03/21/20 8/10    Time 8    Period Weeks    Status New      PT LONG TERM GOAL #2   Title Pt will increase AROM of the knee to WNL in order to complete functional ADLs, sqautting, and normalize gait and stair negotiation    Baseline 03/21/20 Flex 40 ext 4    Time 8    Period Weeks    Status New      PT LONG TERM GOAL #3   Title Pt will demonstrate a 1 RM leg press to 75# for the right knee to demonstrate increased strength in order to complete heavy household ADLs and community participation.    Baseline 03/21/20 unable to complete resistance measure    Time 8    Period Weeks    Status New      PT LONG TERM GOAL #4   Title Patient will demonstrate a 1 RM knee ext of 45# for the right knee in order demonstrate increased strength in order to participate in heavy household  ADLs and community participation.    Baseline 05/08/19 unable to complete resistance measure    Time  8    Period Weeks    Status New      PT LONG TERM GOAL #5   Title Patient will increase FOTO score to 67 to demonstrate predicted increase in functional mobility to complete ADLs    Baseline 03/21/20 47    Time 8    Period Weeks    Status New                   Plan - 08/14/20 0947     Clinical Impression Statement PT continued therex progression for increased LLE strength, power, and mobility with success. Patient demonstrated functional, but markedly less knee flex mobility of LLE with tension reported at common quad tendon. Following TDN patient with full flex ROM. PT reassessed and updated HEP to include increased resistance (5# DB) with decreased reps, with good understanding of education of this. Patient is able to comply with all cuing for proper technique of therex with good motivation throughout session. PT will continue progression as able.    Personal Factors and Comorbidities Comorbidity 1;Comorbidity 2;Time since onset of injury/illness/exacerbation    Comorbidities GERD, anxiety    Examination-Activity Limitations Lift;Locomotion Level;Carry;Squat;Stand;Sleep;Bathing    Examination-Participation Restrictions Community Activity;Cleaning    Stability/Clinical Decision Making Evolving/Moderate complexity    Clinical Decision Making Moderate    Rehab Potential Good    PT Frequency 2x / week    PT Duration 8 weeks    PT Treatment/Interventions ADLs/Self Care Home Management;Electrical Stimulation;Moist Heat;Stair training;Therapeutic exercise;Ultrasound;Traction;DME Instruction;Cryotherapy;Functional mobility training;Therapeutic activities;Neuromuscular re-education;Balance training;Patient/family education;Manual techniques;Dry needling;Passive range of motion;Spinal Manipulations;Joint Manipulations;Gait training    PT Next Visit Plan HEP review, progress therex per rehab  protocol    PT Home Exercise Plan Squat with Chair Touch, Lateral Lunge, Supine Active Straight Leg Raise, Hip Abduction with Resistance Loop, Walking Forward Lunge    Consulted and Agree with Plan of Care Patient             Patient will benefit from skilled therapeutic intervention in order to improve the following deficits and impairments:  Abnormal gait, Decreased activity tolerance, Decreased endurance, Decreased balance, Decreased mobility, Difficulty walking, Impaired flexibility, Impaired tone, Postural dysfunction, Decreased coordination, Decreased range of motion, Decreased strength, Increased fascial restricitons, Improper body mechanics, Pain  Visit Diagnosis: Acute pain of left knee  Stiffness of right knee, not elsewhere classified  Acute pain of right knee     Problem List Patient Active Problem List   Diagnosis Date Noted   Menorrhagia 06/17/2016   Awareness of heartbeats 03/01/2015   Hypertension 01/01/2015   Social phobia involving fear of public speaking 73/22/0254   Acid reflux 08/25/2014   Achrochordon 08/25/2014   Apnea, sleep 08/25/2014   Hypertrophic condition of skin 08/25/2014   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 [place], [address] Phone: [phone]   Fax:  [phone]  Name: Amber Coleman MRN: [id number] Date of Birth: 1976-01-26

## 2020-09-17 ENCOUNTER — Encounter: Payer: Self-pay | Admitting: Physical Therapy

## 2020-09-17 ENCOUNTER — Ambulatory Visit: Payer: BLUE CROSS/BLUE SHIELD | Attending: Orthopaedic Surgery | Admitting: Physical Therapy

## 2020-09-17 DIAGNOSIS — M25562 Pain in left knee: Secondary | ICD-10-CM | POA: Diagnosis not present

## 2020-09-17 NOTE — Therapy (Signed)
[facility] [address], [place], [address] Phone: [phone]   Fax:  [phone]  Physical Therapy Treatment  Patient Details  Name: Amber Coleman MRN: [id number] Date of Birth: 1975/09/29 No data recorded  Encounter Date: 09/17/2020   PT End of Session - 09/17/20 0942     Visit Number 26    Number of Visits 37    Date for PT Re-Evaluation 09/06/20    Authorization Type BCBS 30 02/17/20 - 02/15/21    Authorization Time Period 03/21/20-06/13/20    Authorization - Visit Number 26    Authorization - Number of Visits 30    PT Start Time 0903    PT Stop Time 0943    PT Time Calculation (min) 40 min    Equipment Utilized During Treatment Left knee immobilizer    Activity Tolerance Patient tolerated treatment well;No increased pain    Behavior During Therapy WFL for tasks assessed/performed             Past Medical History:  Diagnosis Date   Anxiety    GERD (gastroesophageal reflux disease)    Hypertension    situational; no longer needs medication   Menorrhagia    Obstructive sleep apnea    uses CPAP    Past Surgical History:  Procedure Laterality Date   BILATERAL SALPINGECTOMY Bilateral 12/24/2017   Procedure: BILATERAL SALPINGECTOMY;  Surgeon: Ward, Honor Loh, MD;  Location: [facility];  Service: Gynecology;  Laterality: Bilateral;   BREAST ENHANCEMENT SURGERY Bilateral 05/2017   BUNIONECTOMY Left 05/13/2016   BUNIONECTOMY Right 12/2016   LIPOSUCTION  06/2016   VAGINAL HYSTERECTOMY N/A 12/24/2017   Procedure: HYSTERECTOMY VAGINAL;  Surgeon: Maceo Pro, MD;  Location: [facility];  Service: Gynecology;  Laterality: N/A;   VULVECTOMY PARTIAL  12/24/2017   Procedure: VULVECTOMY PARTIAL;  Surgeon: Ward, Honor Loh, MD;  Location: [facility];  Service: Gynecology;;   wisdom teeth   1996    There were no vitals filed for this visit.   Subjective Assessment - 09/17/20 0911     Subjective Pt  reports she has been swimming, biking, walking since last visit (walking upwards of 45mles) with no pain. Reports no concerns. Doing well with her HEP    Pertinent History Pt is 45y.o female s/p ACL reconstruction 03/18/20 autograft with L quad tendon. Pain currently 4/10 Worst 8/10 Best 3/10 on NPRS. Pt is able to WBAT per acl rehab protocol, with locked ext brace for 2 weeks (including sleep). Pain can be increased with prolonged walking/standing. Uses ice and medication to help reduce pain level. Pt currently works at home and still able to perform ADLs while wearing brace with modifications. Pt would like progress acl rehab at a respectable to pace to return to prior level of function with ultimate goal to get back to kickboxing.    Limitations Walking;Standing    How long can you sit comfortably? unlimited    How long can you stand comfortably? as tolerated    How long can you walk comfortably? as tolerate    Diagnostic tests Xray and MRI    Patient Stated Goals Progress ACL rehab at respectable pace and return to kickboxing    Pain Onset 1 to 4 weeks ago    Pain Onset More than a month ago              Therex: treadmill - 3 mph for 2 min, increased to jog at 5;5.5;6  mph with good form for 3 min (314mn each speed), decreased to 3.0 mph for 219m walk With running difficulty with full knee ext for LLE contact   Short arc quad x12 without pain/popping clicking; with 2# [id number] x10  [place] squat with 5# DB x10 with good carry over of original technique LAQ 3# x10; 5# x10 with no increased pain, muscle fatigue   SLS 90 hipand knee, knee ext x6 each side; with 50% faster pace x6 each without pain; 75% pace x6 bilat; 50% pace with 2# ankle weight SL deadlift 5# DB in each hand with cuing to prevent hip rotation with good carry over  L cross body jab with BluTB resistance x6; with L step out x6; with step out onto dinadisc x12  Alt lateral jumping x12 with ease good landing FWD  lateral jumps FWD <> BWD 2x 6 each direction, difficulty with L reverse jump no pain     hamstring stretch 14m19mhold                              PT Education - 09/17/20 0922     Education Details therex form/technique    Person(s) Educated Patient    Methods Explanation;Demonstration;Verbal cues    Comprehension Verbalized understanding;Returned demonstration;Verbal cues required              PT Short Term Goals - 03/21/20 1535       PT SHORT TERM GOAL #1   Title Pt will be independent with HEP in order to maintain current strength to further progress therex following rehab protocol.    Baseline 03/21/20    Time 4    Period Weeks    Status New               PT Long Term Goals - 03/21/20 1536       PT LONG TERM GOAL #1   Title Pt will decrease worst pain as reported on NPRS by at least 3 points in order to demonstrate clinically significant reduction in knee pain    Baseline 03/21/20 8/10    Time 8    Period Weeks    Status New      PT LONG TERM GOAL #2   Title Pt will increase AROM of the knee to WNL in order to complete functional ADLs, sqautting, and normalize gait and stair negotiation    Baseline 03/21/20 Flex 40 ext 4    Time 8    Period Weeks    Status New      PT LONG TERM GOAL #3   Title Pt will demonstrate a 1 RM leg press to 75# for the right knee to demonstrate increased strength in order to complete heavy household ADLs and community participation.    Baseline 03/21/20 unable to complete resistance measure    Time 8    Period Weeks    Status New      PT LONG TERM GOAL #4   Title Patient will demonstrate a 1 RM knee ext of 45# for the right knee in order demonstrate increased strength in order to participate in heavy household ADLs and community participation.    Baseline 05/08/19 unable to complete resistance measure    Time 8    Period Weeks    Status New      PT LONG TERM GOAL #5   Title Patient will increase FOTO score  to 67 to demonstrate  predicted increase in functional mobility to complete ADLs    Baseline 03/21/20 47    Time 8    Period Weeks    Status New                   Plan - 09/17/20 1022     Clinical Impression Statement PT continued therex progression with protocol guidelines with increased focus on return to sport (kickboxing) with initiation of open chain knee ext and kickboxing combos with success. Pt is able to comply with all cuing for proper technique of therex with no increased pain throughout therex. Patient is progressing accordingly, and will continue to benefit from skilled PT to address impairments for safe return to full sport activity.    Personal Factors and Comorbidities Comorbidity 1;Comorbidity 2;Time since onset of injury/illness/exacerbation    Comorbidities GERD, anxiety    Examination-Activity Limitations Lift;Locomotion Level;Carry;Squat;Stand;Sleep;Bathing    Examination-Participation Restrictions Community Activity;Cleaning    Stability/Clinical Decision Making Evolving/Moderate complexity    Clinical Decision Making Moderate    Rehab Potential Good    PT Frequency 2x / week    PT Duration 8 weeks    PT Treatment/Interventions ADLs/Self Care Home Management;Electrical Stimulation;Moist Heat;Stair training;Therapeutic exercise;Ultrasound;Traction;DME Instruction;Cryotherapy;Functional mobility training;Therapeutic activities;Neuromuscular re-education;Balance training;Patient/family education;Manual techniques;Dry needling;Passive range of motion;Spinal Manipulations;Joint Manipulations;Gait training    PT Next Visit Plan HEP review, progress therex per rehab protocol    PT Home Exercise Plan Squat with Chair Touch, Lateral Lunge, Supine Active Straight Leg Raise, Hip Abduction with Resistance Loop, Walking Forward Lunge    Consulted and Agree with Plan of Care Patient             Patient will benefit from skilled therapeutic intervention in order to improve  the following deficits and impairments:  Abnormal gait, Decreased activity tolerance, Decreased endurance, Decreased balance, Decreased mobility, Difficulty walking, Impaired flexibility, Impaired tone, Postural dysfunction, Decreased coordination, Decreased range of motion, Decreased strength, Increased fascial restricitons, Improper body mechanics, Pain  Visit Diagnosis: Acute pain of left knee     Problem List Patient Active Problem List   Diagnosis Date Noted   Menorrhagia 06/17/2016   Awareness of heartbeats 03/01/2015   Hypertension 01/01/2015   Social phobia involving fear of public speaking 0000000   Acid reflux 08/25/2014   Achrochordon 08/25/2014   Apnea, sleep 08/25/2014   Hypertrophic condition of skin 08/25/2014   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, [place], [address] Phone: [phone]   Fax:  [phone]  Name: Getsemani Voros MRN: [id number] Date of Birth: 1975-06-09

## 2020-10-02 ENCOUNTER — Ambulatory Visit: Payer: BLUE CROSS/BLUE SHIELD | Admitting: Physical Therapy

## 2020-10-10 ENCOUNTER — Other Ambulatory Visit: Payer: Self-pay

## 2020-10-10 ENCOUNTER — Encounter: Payer: Self-pay | Admitting: Physical Therapy

## 2020-10-10 ENCOUNTER — Ambulatory Visit: Payer: BLUE CROSS/BLUE SHIELD | Admitting: Physical Therapy

## 2020-10-10 DIAGNOSIS — M25562 Pain in left knee: Secondary | ICD-10-CM | POA: Diagnosis not present

## 2020-10-10 NOTE — Therapy (Signed)
[facility] [address], [place], [address] Phone: [phone]   Fax:  [phone]  Physical Therapy Treatment/Progress Report  Reporting Period 06/24/20 - 10/10/20  Patient Details  Name: Amber Coleman MRN: [id number] Date of Birth: 12/26/1975 No data recorded  Encounter Date: 10/10/2020   PT End of Session - 10/10/20 1115     Visit Number 27    Number of Visits 37    Date for PT Re-Evaluation 09/06/20    Authorization Type [employer] 30 02/17/20 - 02/15/21    Authorization Time Period 03/21/20-06/13/20    Authorization - Visit Number 27    Authorization - Number of Visits 30    PT Start Time 1030    PT Stop Time 1115    PT Time Calculation (min) 45 min    Equipment Utilized During Treatment Left knee immobilizer    Activity Tolerance Patient tolerated treatment well;No increased pain    Behavior During Therapy WFL for tasks assessed/performed             Past Medical History:  Diagnosis Date   Anxiety    GERD (gastroesophageal reflux disease)    Hypertension    situational; no longer needs medication   Menorrhagia    Obstructive sleep apnea    uses CPAP    Past Surgical History:  Procedure Laterality Date   BILATERAL SALPINGECTOMY Bilateral 12/24/2017   Procedure: BILATERAL SALPINGECTOMY;  Surgeon: Ward, Honor Loh, MD;  Location: [facility];  Service: Gynecology;  Laterality: Bilateral;   BREAST ENHANCEMENT SURGERY Bilateral 05/2017   BUNIONECTOMY Left 05/13/2016   BUNIONECTOMY Right 12/2016   LIPOSUCTION  06/2016   VAGINAL HYSTERECTOMY N/A 12/24/2017   Procedure: HYSTERECTOMY VAGINAL;  Surgeon: Maceo Pro, MD;  Location: [facility];  Service: Gynecology;  Laterality: N/A;   VULVECTOMY PARTIAL  12/24/2017   Procedure: VULVECTOMY PARTIAL;  Surgeon: Ward, Honor Loh, MD;  Location: [facility];  Service: Gynecology;;   wisdom teeth   1996    There were no vitals filed for this visit.    Subjective Assessment - 10/10/20 1037     Subjective Pt saw MD Griffin Basil who suggested she begins jumping and [place] training and after that she can return to [place]. Is biking, swimming, walking without pain. Is going back to [place] in 2 months.    Pertinent History Pt is 45 y.o female s/p ACL reconstruction 03/18/20 autograft with L quad tendon. Pain currently 4/10 Worst 8/10 Best 3/10 on NPRS. Pt is able to WBAT per acl rehab protocol, with locked ext brace for 2 weeks (including sleep). Pain can be increased with prolonged walking/standing. Uses ice and medication to help reduce pain level. Pt currently works at home and still able to perform ADLs while wearing brace with modifications. Pt would like progress acl rehab at a respectable to pace to return to prior level of function with ultimate goal to get back to kickboxing.    Limitations Walking;Standing    How long can you sit comfortably? unlimited    How long can you stand comfortably? as tolerated    How long can you walk comfortably? as tolerate    Diagnostic tests Xray and MRI    Patient Stated Goals Progress ACL rehab at respectable pace and return to kickboxing    Pain Onset 1 to 4 weeks ago    Pain Onset More than a month ago  Ther-Ex Triple hop LLE 53f 11in RLE 995fVertical jumpLLE 3.5in RLE 7in Squat jumps vertical focus 2x 5 with good carry over of technique Lunge jumps alt 2x 6 with noted fatigue at end 1-2 reps elevated straight leg bridge to failure RLE 27 LLE 27 with much more effort SL bridge (LLE) with 10# db 2x 6 with min cuing for full height with good carry over Lateral jump withhold in end position 2secs x6  PT reviewed the following HEP with patient with patient able to demonstrate a set of the following with min cuing for correction needed. PT educated patient on parameters of therex (how/when to inc/decrease intensity, frequency, rep/set range, stretch hold time, and purpose of therex) with  verbalized understanding.  Access Code: [email or web address] Squat Jumps - 1 x daily - 2 x weekly - 2-3 sets - 5-6 reps Jump Lunges - 1 x daily - 2 x weekly - 2-3 sets - 5-6 reps Lateral Single Leg Lunge Jumps - 1 x daily - 2 x weekly - 2-3 sets - 5-6 reps Single Leg Bridge - 1 x daily - 2 x weekly - 2-3 sets - 5-6 reps *42m38mrest breaks between sets                      PT Education - 10/10/20 1114     Education Details therex form/technique    Person(s) Educated Patient    Methods Explanation;Demonstration;Verbal cues    Comprehension Verbalized understanding;Verbal cues required;Returned demonstration              PT Short Term Goals - 03/21/20 1535       PT SHORT TERM GOAL #1   Title Pt will be independent with HEP in order to maintain current strength to further progress therex following rehab protocol.    Baseline 03/21/20    Time 4    Period Weeks    Status New               PT Long Term Goals - 10/10/20 1041       PT LONG TERM GOAL #1   Title Pt will decrease worst pain as reported on NPRS by at least 3 points in order to demonstrate clinically significant reduction in knee pain    Baseline 03/21/20 8/10 10/10/20 no pain    Time 8    Period Weeks    Status Achieved      PT LONG TERM GOAL #2   Title Pt will increase AROM of the knee to WNL in order to complete functional ADLs, sqautting, and normalize gait and stair negotiation    Baseline 03/21/20 Flex 40 ext 4; 10/10/20 full AROM    Time 8    Period Weeks    Status Achieved      PT LONG TERM GOAL #3   Title Pt will demonstrate a 1 RM leg press to 65# for the right knee to demonstrate increased strength in order to complete heavy household ADLs and community participation.    Baseline 03/21/20 unable to complete resistance measure; 10/10/20 LLE 60# RLE 65#    Time 8    Period Weeks    Status On-going      PT LONG TERM GOAL #4   Title Patient will demonstrate a 1 RM knee ext of 35# for the right knee  in order demonstrate increased strength in order to participate in heavy household ADLs and community participation.    Baseline 05/08/19 unable to complete resistance  measure; 10/10/20 35# 1RM bilat    Time 8    Period Weeks    Status Achieved      PT LONG TERM GOAL #5   Title Patient will increase FOTO score to 67 to demonstrate predicted increase in functional mobility to complete ADLs    Baseline 03/21/20 47    Time 8    Period Weeks    Status Deferred                   Plan - 10/10/20 1132     Clinical Impression Statement PT updated goals this session, to reflect progress, and indicate future of POC with power and plyometric focus for return to sport. Patient is able to comply with all cuing for proper technique of therex, and demonstrates safety in all therex without knee instability and proper landing mechanics, allowing for HEP update to reflect continued plyometric progress. Pt reports MD would like Veil Training to be completed prior to return to sport. This clinic is not familiar with this training, and will need to seek MD clarification for this. PT will continue progression as able.    Personal Factors and Comorbidities Comorbidity 1;Comorbidity 2;Time since onset of injury/illness/exacerbation    Comorbidities GERD, anxiety    Examination-Activity Limitations Lift;Locomotion Level;Carry;Squat;Stand;Sleep;Bathing    Examination-Participation Restrictions Community Activity;Cleaning    Stability/Clinical Decision Making Evolving/Moderate complexity    Clinical Decision Making Moderate    Rehab Potential Good    PT Frequency 2x / week    PT Duration 8 weeks    PT Treatment/Interventions ADLs/Self Care Home Management;Electrical Stimulation;Moist Heat;Stair training;Therapeutic exercise;Ultrasound;Traction;DME Instruction;Cryotherapy;Functional mobility training;Therapeutic activities;Neuromuscular re-education;Balance training;Patient/family education;Manual techniques;Dry  needling;Passive range of motion;Spinal Manipulations;Joint Manipulations;Gait training    PT Next Visit Plan HEP review, progress therex per rehab protocol    PT Home Exercise Plan Squat with Chair Touch, Lateral Lunge, Supine Active Straight Leg Raise, Hip Abduction with Resistance Loop, Walking Forward Lunge    Consulted and Agree with Plan of Care Patient             Patient will benefit from skilled therapeutic intervention in order to improve the following deficits and impairments:  Abnormal gait, Decreased activity tolerance, Decreased endurance, Decreased balance, Decreased mobility, Difficulty walking, Impaired flexibility, Impaired tone, Postural dysfunction, Decreased coordination, Decreased range of motion, Decreased strength, Increased fascial restricitons, Improper body mechanics, Pain  Visit Diagnosis: Acute pain of left knee     Problem List Patient Active Problem List   Diagnosis Date Noted   Menorrhagia 06/17/2016   Awareness of heartbeats 03/01/2015   Hypertension 01/01/2015   Social phobia involving fear of public speaking 0000000   Acid reflux 08/25/2014   Achrochordon 08/25/2014   Apnea, sleep 08/25/2014   Hypertrophic condition of skin 08/25/2014   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, [place], [address] Phone: [phone]   Fax:  [phone]  Name: Amber Coleman MRN: [id number] Date of Birth: 08-03-1975

## 2020-10-17 ENCOUNTER — Ambulatory Visit: Payer: BLUE CROSS/BLUE SHIELD | Admitting: Physical Therapy

## 2020-10-24 ENCOUNTER — Ambulatory Visit: Payer: BLUE CROSS/BLUE SHIELD | Attending: Orthopaedic Surgery | Admitting: Physical Therapy

## 2020-10-24 ENCOUNTER — Encounter: Payer: Self-pay | Admitting: Physical Therapy

## 2020-10-24 DIAGNOSIS — M25562 Pain in left knee: Secondary | ICD-10-CM | POA: Diagnosis not present

## 2020-10-24 NOTE — Therapy (Signed)
[facility] [address] [place], [address] Phone: [phone]   Fax:  [phone]  Physical Therapy Treatment  Patient Details  Name: Amber Coleman MRN: [id number] Date of Birth: Feb 01, 1976 No data recorded  Encounter Date: 10/24/2020   PT End of Session - 10/24/20 0905     Visit Number 28    Number of Visits 37    Date for PT Re-Evaluation 12/10/20    Authorization - Visit Number 28    Authorization - Number of Visits 30    PT Start Time 0904    PT Stop Time 0943    PT Time Calculation (min) 39 min    Equipment Utilized During Treatment Left knee immobilizer    Activity Tolerance Patient tolerated treatment well;No increased pain    Behavior During Therapy WFL for tasks assessed/performed             Past Medical History:  Diagnosis Date   Anxiety    GERD (gastroesophageal reflux disease)    Hypertension    situational; no longer needs medication   Menorrhagia    Obstructive sleep apnea    uses CPAP    Past Surgical History:  Procedure Laterality Date   BILATERAL SALPINGECTOMY Bilateral 12/24/2017   Procedure: BILATERAL SALPINGECTOMY;  Surgeon: Ward, Honor Loh, MD;  Location: [facility];  Service: Gynecology;  Laterality: Bilateral;   BREAST ENHANCEMENT SURGERY Bilateral 05/2017   BUNIONECTOMY Left 05/13/2016   BUNIONECTOMY Right 12/2016   LIPOSUCTION  06/2016   VAGINAL HYSTERECTOMY N/A 12/24/2017   Procedure: HYSTERECTOMY VAGINAL;  Surgeon: Maceo Pro, MD;  Location: [facility];  Service: Gynecology;  Laterality: N/A;   VULVECTOMY PARTIAL  12/24/2017   Procedure: VULVECTOMY PARTIAL;  Surgeon: Ward, Honor Loh, MD;  Location: [facility];  Service: Gynecology;;   wisdom teeth   1996    There were no vitals filed for this visit.   Subjective Assessment - 10/24/20 0909     Subjective Patient reports she is doing well with new HEP reports no pain with completing. Is continuing to have  some superficial clikcing that is not painful    Pertinent History Pt is 45 y.o female s/p ACL reconstruction 03/18/20 autograft with L quad tendon. Pain currently 4/10 Worst 8/10 Best 3/10 on NPRS. Pt is able to WBAT per acl rehab protocol, with locked ext brace for 2 weeks (including sleep). Pain can be increased with prolonged walking/standing. Uses ice and medication to help reduce pain level. Pt currently works at home and still able to perform ADLs while wearing brace with modifications. Pt would like progress acl rehab at a respectable to pace to return to prior level of function with ultimate goal to get back to kickboxing.    Limitations Walking;Standing    How long can you sit comfortably? unlimited    How long can you stand comfortably? as tolerated    How long can you walk comfortably? as tolerate    Diagnostic tests Xray and MRI    Patient Stated Goals Progress ACL rehab at respectable pace and return to kickboxing    Pain Onset 1 to 4 weeks ago    Pain Onset More than a month ago               Ther-Ex Recumbant bike seat 6 resistance 5 38mns Squat jumps x6 with LLE decreased height Lateral jumps x6 with 2sec hold in SLS Lunge jumps x6 with cuing to prevent  hip rotation with decent carry over R reverse lunge to hip flex with L LE jump 2x 6 Mountain climbers 15sec; 2x 0sec Single Leg Bridge straight leg elevated 2x 6; from theraball x6 Hamstring stretch 30sec                          PT Education - 10/24/20 0911     Education Details therex form/technique    Person(s) Educated Patient    Methods Explanation;Demonstration;Verbal cues    Comprehension Verbalized understanding;Returned demonstration;Verbal cues required              PT Short Term Goals - 03/21/20 1535       PT SHORT TERM GOAL #1   Title Pt will be independent with HEP in order to maintain current strength to further progress therex following rehab protocol.    Baseline  03/21/20    Time 4    Period Weeks    Status New               PT Long Term Goals - 10/24/20 0944       PT LONG TERM GOAL #1   Title Pt will decrease worst pain as reported on NPRS by at least 3 points in order to demonstrate clinically significant reduction in knee pain    Baseline 03/21/20 8/10 10/10/20 no pain    Time 8    Period Weeks    Status Achieved      PT LONG TERM GOAL #2   Title Pt will increase AROM of the knee to WNL in order to complete functional ADLs, sqautting, and normalize gait and stair negotiation    Baseline 03/21/20 Flex 40 ext 4; 10/10/20 full AROM    Time 8    Period Weeks    Status Achieved      PT LONG TERM GOAL #3   Title Pt will demonstrate a 1 RM leg press to 65# for the right knee to demonstrate increased strength in order to complete heavy household ADLs and community participation.    Baseline 03/21/20 unable to complete resistance measure; 10/10/20 LLE 60# RLE 65#    Time 8    Period Weeks    Status On-going      PT LONG TERM GOAL #4   Title Patient will demonstrate a 1 RM knee ext of 35# for the right knee in order demonstrate increased strength in order to participate in heavy household ADLs and community participation.    Baseline 05/08/19 unable to complete resistance measure; 10/10/20 35# 1RM bilat    Time 8    Period Weeks    Status Achieved      PT LONG TERM GOAL #5   Title Patient will increase FOTO score to 67 to demonstrate predicted increase in functional mobility to complete ADLs    Baseline 03/21/20 47; 10/24/20 96    Time 8    Period Weeks    Status Achieved      Additional Long Term Goals   Additional Long Term Goals Yes      PT LONG TERM GOAL #6   Title Pt will demonstrate triple hop test of LLE of at least 9.56f to demonstrate 90% horizontal power of opposite LE    Baseline 10/24/20 939f   Time 8    Period Weeks      PT LONG TERM GOAL #7   Title Pt will demonstrate vertical jump test of LLE of at least  6.3in to demonstrate  90% vertical power of opposite LE    Baseline 10/24/20 3.5in    Time 8    Period Weeks    Status New                   Plan - 10/24/20 1054     Clinical Impression Statement PT reviewed current HEP with patient and continued therex progression fro increased LLE power through plyometric training. Patient continues to demonstrate most difficulty with vertical jump height, PT encouraged patient in HEP compliance over the next couple weeks with expectation of completing Patrecia Pour Test next visit to assess return to sport readiness. Patient is able to complete HEP and therex progressions with good carry over cuing, safety with mechanics and good motivation without increased pain throughout session. PT will continue progression as able.    Personal Factors and Comorbidities Comorbidity 1;Comorbidity 2;Time since onset of injury/illness/exacerbation    Comorbidities GERD, anxiety    Examination-Activity Limitations Lift;Locomotion Level;Carry;Squat;Stand;Sleep;Bathing    Examination-Participation Restrictions Community Activity;Cleaning    Stability/Clinical Decision Making Evolving/Moderate complexity    Clinical Decision Making Moderate    Rehab Potential Good    PT Frequency 2x / week    PT Duration 8 weeks    PT Treatment/Interventions ADLs/Self Care Home Management;Electrical Stimulation;Moist Heat;Stair training;Therapeutic exercise;Ultrasound;Traction;DME Instruction;Cryotherapy;Functional mobility training;Therapeutic activities;Neuromuscular re-education;Balance training;Patient/family education;Manual techniques;Dry needling;Passive range of motion;Spinal Manipulations;Joint Manipulations;Gait training    PT Next Visit Plan HEP review, progress therex per rehab protocol    PT Home Exercise Plan Squat with Chair Touch, Lateral Lunge, Supine Active Straight Leg Raise, Hip Abduction with Resistance Loop, Walking Forward Lunge; see 9/2 note for plyo update    Consulted and Agree with Plan of  Care Patient             Patient will benefit from skilled therapeutic intervention in order to improve the following deficits and impairments:  Abnormal gait, Decreased activity tolerance, Decreased endurance, Decreased balance, Decreased mobility, Difficulty walking, Impaired flexibility, Impaired tone, Postural dysfunction, Decreased coordination, Decreased range of motion, Decreased strength, Increased fascial restricitons, Improper body mechanics, Pain  Visit Diagnosis: Acute pain of left knee     Problem List Patient Active Problem List   Diagnosis Date Noted   Menorrhagia 06/17/2016   Awareness of heartbeats 03/01/2015   Hypertension 01/01/2015   Social phobia involving fear of public speaking 0000000   Acid reflux 08/25/2014   Achrochordon 08/25/2014   Apnea, sleep 08/25/2014   Hypertrophic condition of skin 08/25/2014   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, [place], [address] Phone: [phone]   Fax:  [phone]  Name: Amber Coleman MRN: [id number] Date of Birth: [birth year]

## 2020-11-12 ENCOUNTER — Ambulatory Visit: Payer: BLUE CROSS/BLUE SHIELD | Admitting: Physical Therapy

## 2020-11-12 ENCOUNTER — Encounter: Payer: Self-pay | Admitting: Physical Therapy

## 2020-11-12 DIAGNOSIS — M25562 Pain in left knee: Secondary | ICD-10-CM

## 2020-11-12 NOTE — Therapy (Signed)
[facility] [address], [place], [address] Phone: [phone]   Fax:  [phone]  Physical Therapy Treatment  Patient Details  Name: Amber Coleman MRN: [id number] Date of Birth: 15-Apr-1975 No data recorded  Encounter Date: 11/12/2020   PT End of Session - 11/12/20 1346     Visit Number 29    Number of Visits 37    Date for PT Re-Evaluation 12/10/20    Authorization Type [employer] 30 02/17/20 - 02/15/21    Authorization Time Period 03/21/20-06/13/20    Authorization - Visit Number 29    Authorization - Number of Visits 30    PT Start Time 1302    PT Stop Time 1342    PT Time Calculation (min) 40 min    Activity Tolerance Patient tolerated treatment well;No increased pain    Behavior During Therapy WFL for tasks assessed/performed             Past Medical History:  Diagnosis Date   Anxiety    GERD (gastroesophageal reflux disease)    Hypertension    situational; no longer needs medication   Menorrhagia    Obstructive sleep apnea    uses CPAP    Past Surgical History:  Procedure Laterality Date   BILATERAL SALPINGECTOMY Bilateral 12/24/2017   Procedure: BILATERAL SALPINGECTOMY;  Surgeon: Ward, Honor Loh, MD;  Location: [facility];  Service: Gynecology;  Laterality: Bilateral;   BREAST ENHANCEMENT SURGERY Bilateral 05/2017   BUNIONECTOMY Left 05/13/2016   BUNIONECTOMY Right 12/2016   LIPOSUCTION  06/2016   VAGINAL HYSTERECTOMY N/A 12/24/2017   Procedure: HYSTERECTOMY VAGINAL;  Surgeon: Maceo Pro, MD;  Location: [facility];  Service: Gynecology;  Laterality: N/A;   VULVECTOMY PARTIAL  12/24/2017   Procedure: VULVECTOMY PARTIAL;  Surgeon: Ward, Honor Loh, MD;  Location: [facility];  Service: Gynecology;;   wisdom teeth   1996    There were no vitals filed for this visit.   Subjective Assessment - 11/12/20 1306     Subjective Patient is doing well overall. Completing HEP, though admits she has  not been super diligent as she has been super busy lately.    Pertinent History Pt is 45 y.o female s/p ACL reconstruction 03/18/20 autograft with L quad tendon. Pain currently 4/10 Worst 8/10 Best 3/10 on NPRS. Pt is able to WBAT per acl rehab protocol, with locked ext brace for 2 weeks (including sleep). Pain can be increased with prolonged walking/standing. Uses ice and medication to help reduce pain level. Pt currently works at home and still able to perform ADLs while wearing brace with modifications. Pt would like progress acl rehab at a respectable to pace to return to prior level of function with ultimate goal to get back to kickboxing.    Limitations Walking;Standing    How long can you sit comfortably? unlimited    How long can you stand comfortably? as tolerated    How long can you walk comfortably? as tolerate    Diagnostic tests Xray and MRI    Patient Stated Goals Progress ACL rehab at respectable pace and return to kickboxing    Pain Onset 1 to 4 weeks ago    Pain Onset More than a month ago                Ther-Ex [employer] bike L5 69min warmup  [employer] Test SL squat with black band resistance 30-60d for 15min minimal cuing for alignment without valgus  with excellent carry over Lateral bounding 4mins with black band min cuing and demo initially for set up for landing mechanics with excellent carry over Fwd and bwd resisted jogging (black TB) with lines on the floor for visual cue patient able to maintain forward/backward movement without lateral deviation  Verbal review of current HEP with good understanding from patient.                           PT Education - 11/12/20 1346     Education Details therex form/technique    Person(s) Educated Patient    Methods Explanation;Demonstration;Verbal cues    Comprehension Verbalized understanding;Returned demonstration;Verbal cues required              PT Short Term Goals - 03/21/20 1535        PT SHORT TERM GOAL #1   Title Pt will be independent with HEP in order to maintain current strength to further progress therex following rehab protocol.    Baseline 03/21/20    Time 4    Period Weeks    Status New               PT Long Term Goals - 10/24/20 0944       PT LONG TERM GOAL #1   Title Pt will decrease worst pain as reported on NPRS by at least 3 points in order to demonstrate clinically significant reduction in knee pain    Baseline 03/21/20 8/10 10/10/20 no pain    Time 8    Period Weeks    Status Achieved      PT LONG TERM GOAL #2   Title Pt will increase AROM of the knee to WNL in order to complete functional ADLs, sqautting, and normalize gait and stair negotiation    Baseline 03/21/20 Flex 40 ext 4; 10/10/20 full AROM    Time 8    Period Weeks    Status Achieved      PT LONG TERM GOAL #3   Title Pt will demonstrate a 1 RM leg press to 65# for the right knee to demonstrate increased strength in order to complete heavy household ADLs and community participation.    Baseline 03/21/20 unable to complete resistance measure; 10/10/20 LLE 60# RLE 65#    Time 8    Period Weeks    Status On-going      PT LONG TERM GOAL #4   Title Patient will demonstrate a 1 RM knee ext of 35# for the right knee in order demonstrate increased strength in order to participate in heavy household ADLs and community participation.    Baseline 05/08/19 unable to complete resistance measure; 10/10/20 35# 1RM bilat    Time 8    Period Weeks    Status Achieved      PT LONG TERM GOAL #5   Title Patient will increase FOTO score to 67 to demonstrate predicted increase in functional mobility to complete ADLs    Baseline 03/21/20 47; 10/24/20 96    Time 8    Period Weeks    Status Achieved      Additional Long Term Goals   Additional Long Term Goals Yes      PT LONG TERM GOAL #6   Title Pt will demonstrate triple hop test of LLE of at least 9.11ft to demonstrate 90% horizontal power of opposite LE     Baseline 10/24/20 5ft    Time 8    Period  Weeks      PT LONG TERM GOAL #7   Title Pt will demonstrate vertical jump test of LLE of at least 6.3in to demonstrate 90% vertical power of opposite LE    Baseline 10/24/20 3.5in    Time 8    Period Weeks    Status New                   Plan - 11/12/20 1346     Clinical Impression Statement PT completed [employer] for return to sport readniess this session. Patient is able to complete confines of test, and pass test very well with good knee stability throughout exercises, no signs of valgus or pain. Possible difficulty with backward bounding, difficulty with set up in the clinic. PT will re-attempt backward jogging portion of test, as well as prepare patient for discharge next session. Patient is comfortable with current HEP.    Personal Factors and Comorbidities Comorbidity 1;Comorbidity 2;Time since onset of injury/illness/exacerbation    Comorbidities GERD, anxiety    Examination-Activity Limitations Lift;Locomotion Level;Carry;Squat;Stand;Sleep;Bathing    Examination-Participation Restrictions Community Activity;Cleaning    Stability/Clinical Decision Making Evolving/Moderate complexity    Clinical Decision Making Moderate    Rehab Potential Good    PT Frequency 2x / week    PT Duration 8 weeks    PT Treatment/Interventions ADLs/Self Care Home Management;Electrical Stimulation;Moist Heat;Stair training;Therapeutic exercise;Ultrasound;Traction;DME Instruction;Cryotherapy;Functional mobility training;Therapeutic activities;Neuromuscular re-education;Balance training;Patient/family education;Manual techniques;Dry needling;Passive range of motion;Spinal Manipulations;Joint Manipulations;Gait training    PT Next Visit Plan HEP review, progress therex per rehab protocol    PT Home Exercise Plan Squat with Chair Touch, Lateral Lunge, Supine Active Straight Leg Raise, Hip Abduction with Resistance Loop, Walking Forward Lunge; see 9/2  note for plyo update    Consulted and Agree with Plan of Care Patient             Patient will benefit from skilled therapeutic intervention in order to improve the following deficits and impairments:  Abnormal gait, Decreased activity tolerance, Decreased endurance, Decreased balance, Decreased mobility, Difficulty walking, Impaired flexibility, Impaired tone, Postural dysfunction, Decreased coordination, Decreased range of motion, Decreased strength, Increased fascial restricitons, Improper body mechanics, Pain  Visit Diagnosis: Acute pain of left knee     Problem List Patient Active Problem List   Diagnosis Date Noted   Menorrhagia 06/17/2016   Awareness of heartbeats 03/01/2015   Hypertension 01/01/2015   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 [place], [address] Phone: [phone]   Fax:  [phone]  Name: Amber Coleman MRN: [id number] Date of Birth: Dec 15, 1975

## 2020-11-13 ENCOUNTER — Ambulatory Visit: Payer: BLUE CROSS/BLUE SHIELD | Admitting: Physical Therapy

## 2020-12-06 ENCOUNTER — Ambulatory Visit: Payer: BLUE CROSS/BLUE SHIELD | Admitting: Physical Therapy

## 2021-09-16 ENCOUNTER — Ambulatory Visit (INDEPENDENT_AMBULATORY_CARE_PROVIDER_SITE_OTHER): Payer: BLUE CROSS/BLUE SHIELD | Admitting: Obstetrics and Gynecology

## 2021-09-16 ENCOUNTER — Encounter: Payer: Self-pay | Admitting: Obstetrics and Gynecology

## 2021-09-16 VITALS — BP 128/64 | HR 75 | Ht 63.0 in | Wt 123.0 lb

## 2021-09-16 DIAGNOSIS — Z1211 Encounter for screening for malignant neoplasm of colon: Secondary | ICD-10-CM

## 2021-09-16 DIAGNOSIS — Z01419 Encounter for gynecological examination (general) (routine) without abnormal findings: Secondary | ICD-10-CM

## 2021-09-16 NOTE — Progress Notes (Signed)
46 y.o. G60P2002 Married White or Caucasian Not Hispanic or Latino female here for annual exam.  H/O hysterectomy. No vaginal bleeding. No dyspareunia if she uses lubricant. No significant vasomotor symptoms.     Patient's last menstrual period was 11/28/2017 (exact date).          Sexually active: Yes.    The current method of family planning is status post hysterectomy.    Exercising: Yes.     Biking, Walking, Kick boxing  Smoker:  no  Health Maintenance: Pap:  10/20/16 ASCUS Hr HPV Neg  History of abnormal Pap:  yes MMG:  08/27/21 Bi-rads 2 benign ( care everywhere)  BMD:   none  Colonoscopy: none  TDaP:  unsure  Gardasil: none    reports that she has never smoked. She has never used smokeless tobacco. She reports current alcohol use of about 4.0 standard drinks of alcohol per week. She reports that she does not use drugs. She works in Environmental [employer]. Daughters are 76 and 42. Oldest is going to [place] [employer] this fall.   Past Medical History:  Diagnosis Date   Anxiety    Dysmenorrhea    Fibroid    GERD (gastroesophageal reflux disease)    Hypertension    Menorrhagia    Obstructive sleep apnea    uses CPAP    Past Surgical History:  Procedure Laterality Date   ANTERIOR CRUCIATE LIGAMENT REPAIR Left    BILATERAL SALPINGECTOMY Bilateral 12/24/2017   Procedure: BILATERAL SALPINGECTOMY;  Surgeon: Ward, Honor Loh, MD;  Location: [facility];  Service: Gynecology;  Laterality: Bilateral;   BREAST ENHANCEMENT SURGERY Bilateral 05/2017   BUNIONECTOMY Left 05/13/2016   BUNIONECTOMY Right 12/2016   LIPOSUCTION  06/2016   VAGINAL HYSTERECTOMY N/A 12/24/2017   Procedure: HYSTERECTOMY VAGINAL;  Surgeon: Maceo Pro, MD;  Location: [facility];  Service: Gynecology;  Laterality: N/A;   VULVECTOMY PARTIAL  12/24/2017   Procedure: VULVECTOMY PARTIAL;  Surgeon: Ward, Honor Loh, MD;  Location: [facility];  Service: Gynecology;;   wisdom teeth   1996  Vulvectomy was cosmetic.    Current Outpatient Medications  Medication Sig Dispense Refill   chlorthalidone (HYGROTON) 25 MG tablet Take by mouth.     lisinopril (ZESTRIL) 40 MG tablet      No current facility-administered medications for this visit.    Family History  Problem Relation Age of Onset   Diabetes Mother    Heart disease Father    Heart disease Sister    Hypertension Sister    Anxiety disorder Sister    Thyroid disease Sister    Alcoholism Sister    Anxiety disorder Brother    Anxiety disorder Sister    Anxiety disorder Brother    Multiple sclerosis Brother    Lung cancer Maternal Grandfather    Diabetes Paternal Grandfather    Pancreatic cancer Paternal Grandfather     Review of Systems  All other systems reviewed and are negative.   Exam:   BP 128/64   Pulse 75   Ht '5\' 3"'$  (1.6 m)   Wt 123 lb (55.8 kg)   LMP 11/28/2017 (Exact Date)   SpO2 98%   BMI 21.79 kg/m   Weight change: '@WEIGHTCHANGE'$ @ Height:   Height: '5\' 3"'$  (160 cm)  Ht Readings from Last 3 Encounters:  09/16/21 '5\' 3"'$  (1.6 m)  12/16/17 5' 3.5" (1.613 m)  07/15/17 5' 3.5" (1.613 m)    General appearance: alert, cooperative and appears stated age Head: Normocephalic,  without obvious abnormality, atraumatic Neck: no adenopathy, supple, symmetrical, trachea midline and thyroid normal to inspection and palpation Lungs: clear to auscultation bilaterally Cardiovascular: regular rate and rhythm Breasts: normal appearance, no masses or tenderness, bilaterally soft implants Abdomen: soft, non-tender; non distended,  no masses,  no organomegaly Extremities: extremities normal, atraumatic, no cyanosis or edema Skin: Skin color, texture, turgor normal. No rashes or lesions Lymph nodes: Cervical, supraclavicular, and axillary nodes normal. No abnormal inguinal nodes palpated Neurologic: Grossly normal   Pelvic: External genitalia:  no lesions              Urethra:  normal appearing urethra with no masses, tenderness or  lesions              Bartholins and Skenes: normal                 Vagina: normal appearing vagina with normal color and discharge, no lesions              Cervix: absent               Bimanual Exam:  Uterus:  uterus absent              Adnexa: no mass, fullness, tenderness               Rectovaginal: Confirms               Anus:  normal sphincter tone, no lesions  Gae Dry chaperoned for the exam.  1. Well woman exam Discussed breast self exam Discussed calcium and vit D intake Mammogram utd Labs with [facility]  2. Colon cancer screening She will do a colonoscopy, will send me the name of the provider she wants to see in [place], [place]

## 2021-09-16 NOTE — Patient Instructions (Signed)

## 2022-01-13 ENCOUNTER — Encounter: Payer: Self-pay | Admitting: Obstetrics and Gynecology

## 2022-01-13 DIAGNOSIS — Z1211 Encounter for screening for malignant neoplasm of colon: Secondary | ICD-10-CM

## 2022-01-14 NOTE — Telephone Encounter (Signed)
I placed and faxed referral for screening colonscopy.

## 2022-04-06 ENCOUNTER — Encounter: Payer: BLUE CROSS/BLUE SHIELD | Admitting: Plastic Surgery

## 2022-04-07 ENCOUNTER — Encounter: Payer: Self-pay | Admitting: Plastic Surgery

## 2022-04-07 ENCOUNTER — Ambulatory Visit (INDEPENDENT_AMBULATORY_CARE_PROVIDER_SITE_OTHER): Payer: Self-pay | Admitting: Plastic Surgery

## 2022-04-07 DIAGNOSIS — L719 Rosacea, unspecified: Secondary | ICD-10-CM

## 2022-04-07 NOTE — Progress Notes (Signed)
     Patient ID: Amber Coleman, female    DOB: 1975-03-01, 47 y.o.   MRN: [id number]   Chief Complaint  Patient presents with   Laser Consultation    The patient is a 47 year old female here for evaluation of her face.  She has pretty severe rosacea and would like to see if she can improve the appearance.  She also uses a CPAP and it is causing some lines under her eyes.  She does not seem to have acne.    Review of Systems  Constitutional: Negative.   HENT: Negative.    Eyes: Negative.   Respiratory: Negative.    Cardiovascular: Negative.   Gastrointestinal: Negative.   Endocrine: Negative.   Genitourinary: Negative.   Musculoskeletal: Negative.     Past Medical History:  Diagnosis Date   Anxiety    Dysmenorrhea    Fibroid    GERD (gastroesophageal reflux disease)    Hypertension    Menorrhagia    Obstructive sleep apnea    uses CPAP    Past Surgical History:  Procedure Laterality Date   ANTERIOR CRUCIATE LIGAMENT REPAIR Left    BILATERAL SALPINGECTOMY Bilateral 12/24/2017   Procedure: BILATERAL SALPINGECTOMY;  Surgeon: Ward, Honor Loh, MD;  Location: [facility];  Service: Gynecology;  Laterality: Bilateral;   BREAST ENHANCEMENT SURGERY Bilateral 05/2017   BUNIONECTOMY Left 05/13/2016   BUNIONECTOMY Right 12/2016   LIPOSUCTION  06/2016   VAGINAL HYSTERECTOMY N/A 12/24/2017   Procedure: HYSTERECTOMY VAGINAL;  Surgeon: Maceo Pro, MD;  Location: [facility];  Service: Gynecology;  Laterality: N/A;   VULVECTOMY PARTIAL  12/24/2017   Procedure: VULVECTOMY PARTIAL;  Surgeon: Leonides Schanz, Honor Loh, MD;  Location: [facility];  Service: Gynecology;;   wisdom teeth   1996      Current Outpatient Medications:    lisinopril (ZESTRIL) 40 MG tablet, , Disp: , Rfl:    chlorthalidone (HYGROTON) 25 MG tablet, Take by mouth., Disp: , Rfl:    Objective:   There were no vitals filed for this visit.  Physical Exam Vitals and nursing note reviewed.  Constitutional:       Appearance: Normal appearance.  HENT:     Head: Normocephalic and atraumatic.  Skin:    Capillary Refill: Capillary refill takes less than 2 seconds.  Neurological:     Mental Status: She is alert and oriented to person, place, and time.  Psychiatric:        Mood and Affect: Mood normal.        Behavior: Behavior normal.        Thought Content: Thought content normal.        Judgment: Judgment normal.     Assessment & Plan:  Rosacea  Recommend the BBL and then later the halo.  The patient got a quote from the front desk and I will message her the information so she can go to the website. Pictures were obtained of the patient and placed in the chart with the patient's or guardian's permission.   [place], DO

## 2022-04-13 ENCOUNTER — Ambulatory Visit (INDEPENDENT_AMBULATORY_CARE_PROVIDER_SITE_OTHER): Payer: Self-pay | Admitting: Plastic Surgery

## 2022-04-13 ENCOUNTER — Encounter: Payer: Self-pay | Admitting: Plastic Surgery

## 2022-04-13 ENCOUNTER — Other Ambulatory Visit (HOSPITAL_COMMUNITY): Payer: Self-pay

## 2022-04-13 VITALS — BP 130/83 | HR 77 | Ht 63.5 in | Wt 120.0 lb

## 2022-04-13 DIAGNOSIS — L719 Rosacea, unspecified: Secondary | ICD-10-CM

## 2022-04-13 MED ORDER — LIDOCAINE 23% - TETRACAINE 7% TOPICAL OINTMENT (PLASTICIZED): 1.0000 | TOPICAL_OINTMENT | Freq: Once | CUTANEOUS | 0 refills | Status: DC

## 2022-04-13 MED ORDER — LIDOCAINE 23% - TETRACAINE 7% TOPICAL OINTMENT (PLASTICIZED)
1.0000 | TOPICAL_OINTMENT | Freq: Once | CUTANEOUS | 0 refills | Status: AC
  Filled 2022-04-13: qty 60, 1d supply, fill #0

## 2022-04-13 NOTE — Progress Notes (Signed)
Preoperative Dx: hyperpigmentation and redness  Postoperative Dx:  same  Procedure: laser to face   Anesthesia: none  Description of Procedure:  Risks and complications were explained to the patient. Consent was confirmed and signed. Eye protection was placed. Time out was called and all information was confirmed to be correct. The area  area was prepped with alcohol and wiped dry. The BBL laser was set at 515 and 560 nm with 6.5 J/cm2. The face and lateral neck was lasered. The patient tolerated the procedure well and there were no complications. The patient is to follow up in 4 weeks.

## 2022-04-14 ENCOUNTER — Other Ambulatory Visit (HOSPITAL_COMMUNITY): Payer: Self-pay

## 2022-04-23 ENCOUNTER — Other Ambulatory Visit (HOSPITAL_COMMUNITY): Payer: Self-pay

## 2022-05-29 ENCOUNTER — Encounter: Payer: Self-pay | Admitting: Plastic Surgery

## 2022-05-29 ENCOUNTER — Ambulatory Visit (INDEPENDENT_AMBULATORY_CARE_PROVIDER_SITE_OTHER): Payer: Self-pay | Admitting: Plastic Surgery

## 2022-05-29 VITALS — BP 137/83 | HR 90 | Ht 63.5 in | Wt 118.0 lb

## 2022-05-29 DIAGNOSIS — L719 Rosacea, unspecified: Secondary | ICD-10-CM

## 2022-05-29 NOTE — Progress Notes (Signed)
Preoperative Dx: Rosacea  Postoperative Dx:  same  Procedure: laser to face   Anesthesia: none  Description of Procedure:  Risks and complications were explained to the patient. Consent was confirmed and signed. Eye protection was placed. Time out was called and all information was confirmed to be correct. The area  area was prepped with alcohol and wiped dry. The BBL laser was set at 515 and 560 nm at 7 J/cm2. The face was lasered. The patient tolerated the procedure well and there were no complications. The patient is to follow up in 4 weeks.

## 2022-06-30 ENCOUNTER — Ambulatory Visit (INDEPENDENT_AMBULATORY_CARE_PROVIDER_SITE_OTHER): Payer: Self-pay | Admitting: Plastic Surgery

## 2022-06-30 ENCOUNTER — Institutional Professional Consult (permissible substitution): Payer: BLUE CROSS/BLUE SHIELD | Admitting: Plastic Surgery

## 2022-06-30 DIAGNOSIS — L719 Rosacea, unspecified: Secondary | ICD-10-CM

## 2022-06-30 NOTE — Progress Notes (Signed)
Preoperative Dx: hyperpigmentation and redness to face  Postoperative Dx:  same  Procedure: laser to face and neck   Anesthesia: none  Description of Procedure:  Risks and complications were explained to the patient. Consent was confirmed and signed. Eye protection was placed. Time out was called and all information was confirmed to be correct. The area  area was prepped with alcohol and wiped dry. The BBL laser was set at 515 and 560 nm at 7-7.5 J/cm2. The face and neck were lasered. The patient tolerated the procedure well and there were no complications. The patient is to follow up in 4 weeks.

## 2022-07-06 ENCOUNTER — Ambulatory Visit: Payer: Managed Care, Other (non HMO)

## 2023-06-08 ENCOUNTER — Encounter (HOSPITAL_BASED_OUTPATIENT_CLINIC_OR_DEPARTMENT_OTHER): Payer: Self-pay | Admitting: Certified Nurse Midwife

## 2023-06-08 ENCOUNTER — Ambulatory Visit (INDEPENDENT_AMBULATORY_CARE_PROVIDER_SITE_OTHER): Admitting: Certified Nurse Midwife

## 2023-06-08 VITALS — BP 129/81 | HR 74 | Ht 63.5 in | Wt 126.6 lb

## 2023-06-08 DIAGNOSIS — Z01419 Encounter for gynecological examination (general) (routine) without abnormal findings: Secondary | ICD-10-CM

## 2023-06-08 DIAGNOSIS — R9230 Dense breasts, unspecified: Secondary | ICD-10-CM

## 2023-06-08 NOTE — Progress Notes (Signed)
 48 y.o. G19P2002 Married White or Caucasian female here for annual exam.    Patient's last menstrual period was 11/28/2017 (exact date).          Sexually active: Yes.    The current method of family planning is status post hysterectomy.    xercising: Yes.     Smoker:  no  Health Maintenance: Pap:  Pap not indicated due to Hysterectomy (benign disease/fibroids) History of abnormal Pap:  no MMG:  UTD Colonoscopy:  UTD BMD:   n/a Screening Labs: PCP Pt followed by [facility] (Testosterone sublingual, estrogen patch, po progesterone)   reports that she has never smoked. She has never used smokeless tobacco. She reports current alcohol use of about 4.0 standard drinks of alcohol per week. She reports that she does not use drugs.  Past Medical History:  Diagnosis Date   Anxiety    Dysmenorrhea    Fibroid    GERD (gastroesophageal reflux disease)    Hypertension    Menorrhagia    Obstructive sleep apnea    uses CPAP    Past Surgical History:  Procedure Laterality Date   ANTERIOR CRUCIATE LIGAMENT REPAIR Left    BILATERAL SALPINGECTOMY Bilateral 12/24/2017   Procedure: BILATERAL SALPINGECTOMY;  Surgeon: Ward, Margarie Shay, MD;  Location: [facility];  Service: Gynecology;  Laterality: Bilateral;   BREAST ENHANCEMENT SURGERY Bilateral 05/2017   BUNIONECTOMY Left 05/13/2016   BUNIONECTOMY Right 12/2016   LIPOSUCTION  06/2016   VAGINAL HYSTERECTOMY N/A 12/24/2017   Procedure: HYSTERECTOMY VAGINAL;  Surgeon: Myrene Ash, MD;  Location: [facility];  Service: Gynecology;  Laterality: N/A;   VULVECTOMY PARTIAL  12/24/2017   Procedure: VULVECTOMY PARTIAL;  Surgeon: Myrene Ash, MD;  Location: [facility];  Service: Gynecology;;   wisdom teeth   1996    Current Outpatient Medications  Medication Sig Dispense Refill   amLODipine (NORVASC) 5 MG tablet Take 5 mg by mouth daily.     lisinopril  (ZESTRIL ) 40 MG tablet      LYLLANA 0.0375 MG/24HR Place 1 patch onto the skin every  3 (three) days.     MAGNESIUM PO Take 1 tablet by mouth at bedtime as needed.     progesterone (PROMETRIUM) 100 MG capsule Take 200 mg by mouth at bedtime.     chlorthalidone (HYGROTON) 25 MG tablet Take by mouth.     No current facility-administered medications for this visit.    Family History  Problem Relation Age of Onset   Diabetes Mother    Heart disease Father    Heart disease Sister    Hypertension Sister    Anxiety disorder Sister    Thyroid  disease Sister    Alcoholism Sister    Anxiety disorder Brother    Anxiety disorder Sister    Anxiety disorder Brother    Multiple sclerosis Brother    Lung cancer Maternal Grandfather    Diabetes Paternal Grandfather    Pancreatic cancer Paternal Grandfather     ROS: Constitutional: negative Genitourinary:negative  Exam:   BP 129/81 (BP Location: Right Arm, Patient Position: Sitting, Cuff Size: Normal)   Pulse 74   Ht 5' 3.5" (1.613 m) Comment: Reported  Wt 126 lb 9.6 oz (57.4 kg)   LMP 11/28/2017 (Exact Date)   BMI 22.07 kg/m   Height: 5' 3.5" (161.3 cm) (Reported)  General appearance: alert, cooperative and appears stated age Head: Normocephalic, without obvious abnormality, atraumatic Breasts: normal appearance, no masses or tenderness, Inspection negative, No nipple retraction or dimpling,  No nipple discharge or bleeding, No axillary or supraclavicular adenopathy, Normal to palpation without dominant masses Abdomen: soft, non-tender; bowel sounds normal; no masses,  no organomegaly Extremities: extremities normal, atraumatic, no cyanosis or edema Skin: Skin color, texture, turgor normal. No rashes or lesions Lymph nodes: Cervical, supraclavicular, and axillary nodes normal. No abnormal inguinal nodes palpated Neurologic: Grossly normal   Pelvic: External genitalia:  no lesions              Urethra:  normal appearing urethra with no masses, tenderness or lesions              Bartholins and Skenes: normal                  Vagina: normal appearing vagina with normal color and no discharge, no lesions              Cervix: absent              Pap taken: No. Bimanual Exam:  Uterus:  uterus absent              Adnexa: no mass, fullness, tenderness             Anus:  normal sphincter tone, no lesions  Chaperone, CMA, was present for exam.  Assessment/Plan:  1. Encounter for annual routine gynecological examination - Continue self breast awareness - Pt will consider Shingles Vaccination series - Pap Smear not indicated (Hysterectomy  benign disease)  2. Dense breast tissue (Primary) - MR BREAST BILATERAL W WO CONTRAST INC CAD; Future   RTO 1 year for annual gyn exam and prn if issues arise. Amber Coleman

## 2024-02-11 ENCOUNTER — Encounter (HOSPITAL_BASED_OUTPATIENT_CLINIC_OR_DEPARTMENT_OTHER): Payer: Self-pay | Admitting: Certified Nurse Midwife

## 2024-02-14 ENCOUNTER — Other Ambulatory Visit (HOSPITAL_BASED_OUTPATIENT_CLINIC_OR_DEPARTMENT_OTHER): Payer: Self-pay

## 2024-02-14 DIAGNOSIS — R9230 Dense breasts, unspecified: Secondary | ICD-10-CM

## 2024-02-21 ENCOUNTER — Encounter (HOSPITAL_BASED_OUTPATIENT_CLINIC_OR_DEPARTMENT_OTHER): Payer: Self-pay | Admitting: Certified Nurse Midwife

## 2024-03-02 ENCOUNTER — Other Ambulatory Visit

## 2024-03-03 ENCOUNTER — Inpatient Hospital Stay: Admission: RE | Admit: 2024-03-03 | Payer: Self-pay | Source: Ambulatory Visit

## 2024-03-16 ENCOUNTER — Ambulatory Visit
Admission: RE | Admit: 2024-03-16 | Discharge: 2024-03-16 | Disposition: A | Discharge status: Home / Self Care | Payer: Self-pay | Source: Ambulatory Visit | Attending: Certified Nurse Midwife | Admitting: Certified Nurse Midwife

## 2024-03-16 DIAGNOSIS — R9230 Dense breasts, unspecified: Secondary | ICD-10-CM

## 2024-03-16 MED ORDER — GADOPICLENOL 0.5 MMOL/ML IV SOLN
6.0000 mL | Freq: Once | INTRAVENOUS | Status: AC | PRN
  Administered 2024-03-16: 6 mL via INTRAVENOUS

## 2024-03-24 ENCOUNTER — Ambulatory Visit: Admission: RE | Admit: 2024-03-24 | Payer: Self-pay | Source: Ambulatory Visit

## 2024-03-24 ENCOUNTER — Other Ambulatory Visit (HOSPITAL_BASED_OUTPATIENT_CLINIC_OR_DEPARTMENT_OTHER): Payer: Self-pay | Admitting: Certified Nurse Midwife

## 2024-03-24 DIAGNOSIS — R9230 Dense breasts, unspecified: Secondary | ICD-10-CM

## 2024-03-24 MED ORDER — GADOPICLENOL 0.5 MMOL/ML IV SOLN
6.0000 mL | Freq: Once | INTRAVENOUS | Status: AC | PRN
  Administered 2024-03-24: 6 mL via INTRAVENOUS

## 2024-06-15 ENCOUNTER — Ambulatory Visit (HOSPITAL_BASED_OUTPATIENT_CLINIC_OR_DEPARTMENT_OTHER): Payer: Self-pay | Admitting: Obstetrics and Gynecology
